# Patient Record
Sex: Female | Born: 1985 | Hispanic: No | Marital: Single | Smoking: Former smoker
Health system: Southern US, Community
[De-identification: ages and names within clinical notes are randomized; demographics above are authoritative.]

## PROBLEM LIST (undated history)

## (undated) ENCOUNTER — Inpatient Hospital Stay (HOSPITAL_COMMUNITY): Payer: Self-pay

## (undated) DIAGNOSIS — Z789 Other specified health status: Secondary | ICD-10-CM

## (undated) HISTORY — PX: NO PAST SURGERIES: SHX2092

---

## 2014-06-23 NOTE — L&D Delivery Note (Signed)
Patient is 29 y.o. [id number] [redacted]w[redacted]d admitted for SOL.   Delivery Note At 5:16 PM a viable female was delivered via Vaginal, Spontaneous Delivery (Presentation: Left Occiput Anterior). Shoulder dystocia, approximately 15 sec. McRoberts maneuver and suprapubic pressure applied. APGAR: 9, 9; weight 8 lb 5.3 oz.   Placenta status: spontaneous, intact.  Cord: 3 vessels with the following complications: nuchal cord x1.   Anesthesia: Epidural  Episiotomy: None Lacerations:  None Est. Blood Loss (mL):  50  Mom to postpartum.  Baby to Couplet care / Skin to Skin.  Tarri Abernethy, MD PGY-1 [facility]  03/15/2015, 5:43 PM

## 2014-10-25 LAB — OB RESULTS CONSOLE ANTIBODY SCREEN: ANTIBODY SCREEN: NEGATIVE

## 2014-10-25 LAB — OB RESULTS CONSOLE RPR: RPR: NONREACTIVE

## 2014-10-25 LAB — OB RESULTS CONSOLE ABO/RH: RH Type: POSITIVE

## 2014-10-25 LAB — OB RESULTS CONSOLE GC/CHLAMYDIA
CHLAMYDIA, DNA PROBE: POSITIVE
Gonorrhea: NEGATIVE

## 2014-10-25 LAB — OB RESULTS CONSOLE HIV ANTIBODY (ROUTINE TESTING): HIV: NONREACTIVE

## 2014-10-25 LAB — OB RESULTS CONSOLE HEPATITIS B SURFACE ANTIGEN: Hepatitis B Surface Ag: NEGATIVE

## 2015-01-03 ENCOUNTER — Inpatient Hospital Stay (HOSPITAL_COMMUNITY)
Admission: AD | Admit: 2015-01-03 | Discharge: 2015-01-03 | Disposition: A | Discharge status: Home / Self Care | Payer: Medicaid Other | Source: Ambulatory Visit | Attending: Obstetrics and Gynecology | Admitting: Obstetrics and Gynecology

## 2015-01-03 ENCOUNTER — Encounter (HOSPITAL_COMMUNITY): Payer: Self-pay | Admitting: *Deleted

## 2015-01-03 DIAGNOSIS — R102 Pelvic and perineal pain: Secondary | ICD-10-CM | POA: Diagnosis not present

## 2015-01-03 DIAGNOSIS — Z3A30 30 weeks gestation of pregnancy: Secondary | ICD-10-CM | POA: Diagnosis not present

## 2015-01-03 DIAGNOSIS — O9989 Other specified diseases and conditions complicating pregnancy, childbirth and the puerperium: Secondary | ICD-10-CM | POA: Diagnosis not present

## 2015-01-03 DIAGNOSIS — M5430 Sciatica, unspecified side: Secondary | ICD-10-CM | POA: Insufficient documentation

## 2015-01-03 DIAGNOSIS — M5432 Sciatica, left side: Secondary | ICD-10-CM

## 2015-01-03 DIAGNOSIS — R1030 Lower abdominal pain, unspecified: Secondary | ICD-10-CM | POA: Diagnosis present

## 2015-01-03 DIAGNOSIS — N949 Unspecified condition associated with female genital organs and menstrual cycle: Secondary | ICD-10-CM

## 2015-01-03 HISTORY — DX: Other specified health status: Z78.9

## 2015-01-03 NOTE — MAU Note (Signed)
Pt reports pain in bilateral lower abd and lower back, states she has a lot of pressure. Reports she is 6 months pregnant and [place]. Denies bleeding.

## 2015-01-03 NOTE — MAU Provider Note (Signed)
History     CSN: [id number]  Arrival date and time: 01/03/15 2124   First Provider Initiated Contact with Patient 01/03/15 2229      No chief complaint on file.  HPI  OB History    Gravida Para Term Preterm AB TAB SAB Ectopic Multiple Living   Patient is 29 y.o. [id number] [redacted]w[redacted]d here with complaints of Back pain and lower abdominal pain starting 3 days ago.  Describes pain as intermittent, worsening, cramping, and some shooting pain down legs and tingling.  Vomiting once this AM - NBNB.  Not taking any medications except PNV.  Just moved here from [place], last visit in May for [facility], looking for clinic to establish for prenatal care.  +FM, denies LOF, VB, contractions, vaginal discharge.    Past Medical History  Diagnosis Date  . Medical history non-contributory     Past Surgical History  Procedure Laterality Date  . No past surgeries      No family history on file.  History  Substance Use Topics  . Smoking status: Former Games developer  . Smokeless tobacco: Not on file  . Alcohol Use: No    Allergies: Allergies not on file  No prescriptions prior to admission    Review of Systems  Constitutional: Negative for fever and chills.  Eyes: Negative for blurred vision.  Respiratory: Negative for cough, shortness of breath and wheezing.   Cardiovascular: Negative for chest pain and leg swelling.  Gastrointestinal: Positive for abdominal pain. Negative for nausea, diarrhea, constipation and blood in stool.  Genitourinary: Negative for dysuria, urgency, frequency and hematuria.  Musculoskeletal: Positive for back pain. Negative for joint pain.  Neurological: Negative for headaches.  All other systems reviewed and are negative.  Physical Exam   Blood pressure 126/66, pulse 69, temperature 98.7 F (37.1 C), temperature source Oral, resp. rate 18, height  (1.803 m), weight 281 lb (127.461 kg), SpO2 100 %.  Physical Exam  Constitutional: She is  oriented to person, place, and time. She appears well-developed and well-nourished. No distress.  HENT:  Head: Normocephalic and atraumatic.  Mouth/Throat: Oropharynx is clear and moist.  Eyes: EOM are normal. Pupils are equal, round, and reactive to light.  Neck: Normal range of motion. Neck supple.  Cardiovascular: Normal rate, regular rhythm and intact distal pulses.   No murmur heard. Respiratory: Effort normal and breath sounds normal. No respiratory distress. She has no wheezes.  GI: She exhibits no distension. There is no tenderness.  gravid  Genitourinary: Vagina normal.  Musculoskeletal: She exhibits no edema.  TTP in paraspinal muscles in lumbar/sacral area +straight leg raise in L leg Gait normal  Neurological: She is alert and oriented to person, place, and time. No cranial nerve deficit.  Skin: Skin is warm and dry. No rash noted.  Psychiatric: She has a normal mood and affect. Her behavior is normal. Thought content normal.    Dilation: Fingertip Effacement (%): Thick Exam by:: Dr. Caleb Popp  MAU Course  Procedures  MDM FHM: Cat 1, reassuring Toco: No UCs, irritability  Assessment and Plan  Patient is 29 y.o. [id number] with viable IUP at [redacted]w[redacted]d Sciatica and round ligament pain  - d/c home - will set up f/u with [facility] - fetal kick counts reinforced - preterm labor precautions - reassured patient - rec pregnancy support belt  Erasmo Downer, MD, MPH PGY-2,  [facility] 01/03/2015 10:39 PM  I have participated in the care of this patient and I agree with the above. Cam HaiSHAW, Babyboy Loya CNM 5:04 AM 01/04/2015

## 2015-01-03 NOTE — Discharge Instructions (Signed)
Sciatica Sciatica is pain, weakness, numbness, or tingling along the path of the sciatic nerve. The nerve starts in the lower back and runs down the back of each leg. The nerve controls the muscles in the lower leg and in the back of the knee, while also providing sensation to the back of the thigh, lower leg, and the sole of your foot. Sciatica is a symptom of another medical condition. For instance, nerve damage or certain conditions, such as a herniated disk or bone spur on the spine, pinch or put pressure on the sciatic nerve. This causes the pain, weakness, or other sensations normally associated with sciatica. Generally, sciatica only affects one side of the body. CAUSES   Herniated or slipped disc.  Degenerative disk disease.  A pain disorder involving the narrow muscle in the buttocks (piriformis syndrome).  Pelvic injury or fracture.  Pregnancy.  Tumor (rare). SYMPTOMS  Symptoms can vary from mild to very severe. The symptoms usually travel from the low back to the buttocks and down the back of the leg. Symptoms can include:  Mild tingling or dull aches in the lower back, leg, or hip.  Numbness in the back of the calf or sole of the foot.  Burning sensations in the lower back, leg, or hip.  Sharp pains in the lower back, leg, or hip.  Leg weakness.  Severe back pain inhibiting movement. These symptoms may get worse with coughing, sneezing, laughing, or prolonged sitting or standing. Also, being overweight may worsen symptoms. DIAGNOSIS  Your caregiver will perform a physical exam to look for common symptoms of sciatica. He or she may ask you to do certain movements or activities that would trigger sciatic nerve pain. Other tests may be performed to find the cause of the sciatica. These may include:  Blood tests.  X-rays.  Imaging tests, such as an MRI or CT scan. TREATMENT  Treatment is directed at the cause of the sciatic pain. Sometimes, treatment is not necessary  and the pain and discomfort goes away on its own. If treatment is needed, your caregiver may suggest:  Over-the-counter medicines to relieve pain.  Prescription medicines, such as anti-inflammatory medicine, muscle relaxants, or narcotics.  Applying heat or ice to the painful area.  Steroid injections to lessen pain, irritation, and inflammation around the nerve.  Reducing activity during periods of pain.  Exercising and stretching to strengthen your abdomen and improve flexibility of your spine. Your caregiver may suggest losing weight if the extra weight makes the back pain worse.  Physical therapy.  Surgery to eliminate what is pressing or pinching the nerve, such as a bone spur or part of a herniated disk. HOME CARE INSTRUCTIONS   Only take over-the-counter or prescription medicines for pain or discomfort as directed by your caregiver.  Apply ice to the affected area for 20 minutes, 3-4 times a day for the first 48-72 hours. Then try heat in the same way.  Exercise, stretch, or perform your usual activities if these do not aggravate your pain.  Attend physical therapy sessions as directed by your caregiver.  Keep all follow-up appointments as directed by your caregiver.  Do not wear high heels or shoes that do not provide proper support.  Check your mattress to see if it is too soft. A firm mattress may lessen your pain and discomfort. SEEK IMMEDIATE MEDICAL CARE IF:   You lose control of your bowel or bladder (incontinence).  You have increasing weakness in the lower back, pelvis, buttocks,   or legs.  You have redness or swelling of your back.  You have a burning sensation when you urinate.  You have pain that gets worse when you lie down or awakens you at night.  Your pain is worse than you have experienced in the past.  Your pain is lasting longer than 4 weeks.  You are suddenly losing weight without reason. MAKE SURE YOU:  Understand these  instructions.  Will watch your condition.  Will get help right away if you are not doing well or get worse. Document Released: 06/03/2001 Document Revised: 12/09/2011 Document Reviewed: 10/19/2011 ExitCare Patient Information 2015 [employer]. This information is not intended to replace advice given to you by your health care provider. Make sure you discuss any questions you have with your health care provider.  

## 2015-01-22 ENCOUNTER — Encounter: Payer: Medicaid Other | Admitting: Advanced Practice Midwife

## 2015-02-26 ENCOUNTER — Inpatient Hospital Stay (HOSPITAL_COMMUNITY)
Admission: AD | Admit: 2015-02-26 | Discharge: 2015-02-26 | Disposition: A | Discharge status: Home / Self Care | Payer: Medicaid Other | Source: Ambulatory Visit | Attending: Obstetrics & Gynecology | Admitting: Obstetrics & Gynecology

## 2015-02-26 ENCOUNTER — Encounter (HOSPITAL_COMMUNITY): Payer: Self-pay | Admitting: *Deleted

## 2015-02-26 DIAGNOSIS — Z3493 Encounter for supervision of normal pregnancy, unspecified, third trimester: Secondary | ICD-10-CM | POA: Diagnosis not present

## 2015-02-26 NOTE — MAU Note (Signed)
Contractions started last night, have gotten closer and stronger. No bleeding.

## 2015-02-26 NOTE — Discharge Instructions (Signed)

## 2015-03-01 LAB — OB RESULTS CONSOLE GC/CHLAMYDIA
Chlamydia: POSITIVE
GC PROBE AMP, GENITAL: NEGATIVE

## 2015-03-01 LAB — OB RESULTS CONSOLE GBS: STREP GROUP B AG: NEGATIVE

## 2015-03-15 ENCOUNTER — Encounter (HOSPITAL_COMMUNITY): Payer: Self-pay

## 2015-03-15 ENCOUNTER — Inpatient Hospital Stay (HOSPITAL_COMMUNITY): Payer: Medicaid Other | Admitting: Anesthesiology

## 2015-03-15 ENCOUNTER — Inpatient Hospital Stay (HOSPITAL_COMMUNITY)
Admission: AD | Admit: 2015-03-15 | Discharge: 2015-03-17 | DRG: 774 | Disposition: A | Discharge status: Home / Self Care | Payer: Medicaid Other | Source: Ambulatory Visit | Attending: Obstetrics and Gynecology | Admitting: Obstetrics and Gynecology

## 2015-03-15 DIAGNOSIS — Z6841 Body Mass Index (BMI) 40.0 and over, adult: Secondary | ICD-10-CM

## 2015-03-15 DIAGNOSIS — O99214 Obesity complicating childbirth: Secondary | ICD-10-CM | POA: Diagnosis present

## 2015-03-15 DIAGNOSIS — O9882 Other maternal infectious and parasitic diseases complicating childbirth: Secondary | ICD-10-CM | POA: Diagnosis present

## 2015-03-15 DIAGNOSIS — Z3A40 40 weeks gestation of pregnancy: Secondary | ICD-10-CM | POA: Diagnosis present

## 2015-03-15 DIAGNOSIS — A749 Chlamydial infection, unspecified: Secondary | ICD-10-CM | POA: Diagnosis present

## 2015-03-15 DIAGNOSIS — Z87891 Personal history of nicotine dependence: Secondary | ICD-10-CM

## 2015-03-15 DIAGNOSIS — IMO0001 Reserved for inherently not codable concepts without codable children: Secondary | ICD-10-CM | POA: Insufficient documentation

## 2015-03-15 DIAGNOSIS — O480 Post-term pregnancy: Principal | ICD-10-CM | POA: Diagnosis present

## 2015-03-15 LAB — CBC
HCT: 26.7 % — ABNORMAL LOW (ref 36.0–46.0)
HEMOGLOBIN: 8.7 g/dL — AB (ref 12.0–15.0)
MCH: 26.3 pg (ref 26.0–34.0)
MCHC: 32.6 g/dL (ref 30.0–36.0)
MCV: 80.7 fL (ref 78.0–100.0)
PLATELETS: 299 10*3/uL (ref 150–400)
RBC: 3.31 MIL/uL — AB (ref 3.87–5.11)
RDW: 14.1 % (ref 11.5–15.5)
WBC: 14.7 10*3/uL — AB (ref 4.0–10.5)

## 2015-03-15 LAB — TYPE AND SCREEN
ABO/RH(D): B POS
ANTIBODY SCREEN: NEGATIVE

## 2015-03-15 LAB — GROUP B STREP BY PCR: Group B strep by PCR: NEGATIVE

## 2015-03-15 LAB — ABO/RH: ABO/RH(D): B POS

## 2015-03-15 LAB — RPR: RPR: NONREACTIVE

## 2015-03-15 MED ORDER — ONDANSETRON HCL 4 MG/2ML IJ SOLN: 4.0000 mg | Freq: Four times a day (QID) | INTRAMUSCULAR | Status: DC | PRN

## 2015-03-15 MED ORDER — LACTATED RINGERS IV SOLN
500.0000 mL | INTRAVENOUS | Status: DC | PRN
  Administered 2015-03-15: 500 mL via INTRAVENOUS

## 2015-03-15 MED ORDER — ZOLPIDEM TARTRATE 5 MG PO TABS: 5.0000 mg | ORAL_TABLET | Freq: Every evening | ORAL | Status: DC | PRN

## 2015-03-15 MED ORDER — OXYTOCIN 40 UNITS IN LACTATED RINGERS INFUSION - SIMPLE MED
62.5000 mL/h | INTRAVENOUS | Status: DC
  Filled 2015-03-15: qty 1000

## 2015-03-15 MED ORDER — ACETAMINOPHEN 325 MG PO TABS: 650.0000 mg | ORAL_TABLET | ORAL | Status: DC | PRN

## 2015-03-15 MED ORDER — OXYCODONE-ACETAMINOPHEN 5-325 MG PO TABS: 1.0000 | ORAL_TABLET | ORAL | Status: DC | PRN

## 2015-03-15 MED ORDER — LIDOCAINE HCL (PF) 1 % IJ SOLN
30.0000 mL | INTRAMUSCULAR | Status: DC | PRN
  Filled 2015-03-15: qty 30

## 2015-03-15 MED ORDER — FLEET ENEMA 7-19 GM/118ML RE ENEM
1.0000 | ENEMA | RECTAL | Status: DC | PRN
Start: 2015-03-15 — End: 2015-03-15

## 2015-03-15 MED ORDER — SIMETHICONE 80 MG PO CHEW: 80.0000 mg | CHEWABLE_TABLET | ORAL | Status: DC | PRN

## 2015-03-15 MED ORDER — ONDANSETRON HCL 4 MG PO TABS
4.0000 mg | ORAL_TABLET | ORAL | Status: DC | PRN
Start: 2015-03-15 — End: 2015-03-17

## 2015-03-15 MED ORDER — OXYTOCIN 40 UNITS IN LACTATED RINGERS INFUSION - SIMPLE MED
1.0000 m[IU]/min | INTRAVENOUS | Status: DC
  Administered 2015-03-15: 2 m[IU]/min via INTRAVENOUS

## 2015-03-15 MED ORDER — DIPHENHYDRAMINE HCL 25 MG PO CAPS: 25.0000 mg | ORAL_CAPSULE | Freq: Four times a day (QID) | ORAL | Status: DC | PRN

## 2015-03-15 MED ORDER — OXYCODONE-ACETAMINOPHEN 5-325 MG PO TABS: 2.0000 | ORAL_TABLET | ORAL | Status: DC | PRN

## 2015-03-15 MED ORDER — ONDANSETRON HCL 4 MG/2ML IJ SOLN: 4.0000 mg | INTRAMUSCULAR | Status: DC | PRN

## 2015-03-15 MED ORDER — LANOLIN HYDROUS EX OINT: TOPICAL_OINTMENT | CUTANEOUS | Status: DC | PRN

## 2015-03-15 MED ORDER — PHENYLEPHRINE 40 MCG/ML (10ML) SYRINGE FOR IV PUSH (FOR BLOOD PRESSURE SUPPORT)
80.0000 ug | PREFILLED_SYRINGE | INTRAVENOUS | Status: DC | PRN
  Filled 2015-03-15: qty 20
  Filled 2015-03-15: qty 2
  Filled 2015-03-15 (×2): qty 20

## 2015-03-15 MED ORDER — PRENATAL MULTIVITAMIN CH
1.0000 | ORAL_TABLET | Freq: Every day | ORAL | Status: DC
  Administered 2015-03-16 – 2015-03-17 (×2): 1 via ORAL
  Filled 2015-03-15 (×2): qty 1

## 2015-03-15 MED ORDER — EPHEDRINE 5 MG/ML INJ
10.0000 mg | INTRAVENOUS | Status: DC | PRN
Start: 2015-03-15 — End: 2015-03-15
  Filled 2015-03-15: qty 2

## 2015-03-15 MED ORDER — WITCH HAZEL-GLYCERIN EX PADS: 1.0000 "application " | MEDICATED_PAD | CUTANEOUS | Status: DC | PRN

## 2015-03-15 MED ORDER — LIDOCAINE HCL (PF) 1 % IJ SOLN
INTRAMUSCULAR | Status: DC | PRN
  Administered 2015-03-15: 9 mL via EPIDURAL
  Administered 2015-03-15: 8 mL via EPIDURAL

## 2015-03-15 MED ORDER — FENTANYL CITRATE (PF) 100 MCG/2ML IJ SOLN: 100.0000 ug | INTRAMUSCULAR | Status: DC | PRN

## 2015-03-15 MED ORDER — SODIUM BICARBONATE 8.4 % IV SOLN
INTRAVENOUS | Status: DC | PRN
  Administered 2015-03-15 (×2): 5 mL via EPIDURAL

## 2015-03-15 MED ORDER — TETANUS-DIPHTH-ACELL PERTUSSIS 5-2.5-18.5 LF-MCG/0.5 IM SUSP
0.5000 mL | Freq: Once | INTRAMUSCULAR | Status: AC
  Administered 2015-03-16: 0.5 mL via INTRAMUSCULAR
  Filled 2015-03-15: qty 0.5

## 2015-03-15 MED ORDER — SENNOSIDES-DOCUSATE SODIUM 8.6-50 MG PO TABS
2.0000 | ORAL_TABLET | ORAL | Status: DC
  Administered 2015-03-15 – 2015-03-16 (×2): 2 via ORAL
  Filled 2015-03-15 (×2): qty 2

## 2015-03-15 MED ORDER — FENTANYL 2.5 MCG/ML BUPIVACAINE 1/10 % EPIDURAL INFUSION (WH - ANES)
14.0000 mL/h | INTRAMUSCULAR | Status: DC | PRN
  Administered 2015-03-15 (×3): 14 mL/h via EPIDURAL
  Filled 2015-03-15 (×2): qty 125

## 2015-03-15 MED ORDER — AZITHROMYCIN 1 G PO PACK
1.0000 g | PACK | Freq: Once | ORAL | Status: AC
  Administered 2015-03-15: 1 g via ORAL
  Filled 2015-03-15: qty 1

## 2015-03-15 MED ORDER — CITRIC ACID-SODIUM CITRATE 334-500 MG/5ML PO SOLN: 30.0000 mL | ORAL | Status: DC | PRN

## 2015-03-15 MED ORDER — OXYTOCIN BOLUS FROM INFUSION
500.0000 mL | INTRAVENOUS | Status: DC
  Administered 2015-03-15: 500 mL via INTRAVENOUS

## 2015-03-15 MED ORDER — TERBUTALINE SULFATE 1 MG/ML IJ SOLN
0.2500 mg | Freq: Once | INTRAMUSCULAR | Status: DC | PRN
  Filled 2015-03-15: qty 1

## 2015-03-15 MED ORDER — LACTATED RINGERS IV SOLN
INTRAVENOUS | Status: DC
  Administered 2015-03-15 (×2): via INTRAVENOUS

## 2015-03-15 MED ORDER — BENZOCAINE-MENTHOL 20-0.5 % EX AERO: 1.0000 "application " | INHALATION_SPRAY | CUTANEOUS | Status: DC | PRN

## 2015-03-15 MED ORDER — IBUPROFEN 600 MG PO TABS
600.0000 mg | ORAL_TABLET | Freq: Four times a day (QID) | ORAL | Status: DC
  Administered 2015-03-15 – 2015-03-17 (×6): 600 mg via ORAL
  Filled 2015-03-15 (×7): qty 1

## 2015-03-15 MED ORDER — DIPHENHYDRAMINE HCL 50 MG/ML IJ SOLN: 12.5000 mg | INTRAMUSCULAR | Status: DC | PRN

## 2015-03-15 MED ORDER — DIBUCAINE 1 % RE OINT: 1.0000 "application " | TOPICAL_OINTMENT | RECTAL | Status: DC | PRN

## 2015-03-15 NOTE — Anesthesia Preprocedure Evaluation (Signed)
Anesthesia Evaluation  Patient identified by MRN, date of birth, ID band Patient awake    Reviewed: Allergy & Precautions, H&P , NPO status , Patient's Chart, lab work & pertinent test results  Airway Mallampati: II  TM Distance: >3 FB Neck ROM: full    Dental no notable dental hx.    Pulmonary former smoker,    Pulmonary exam normal        Cardiovascular negative cardio ROS Normal cardiovascular exam     Neuro/Psych negative neurological ROS  negative psych ROS   GI/Hepatic negative GI ROS, Neg liver ROS,   Endo/Other  Morbid obesity  Renal/GU negative Renal ROS     Musculoskeletal   Abdominal (+) + obese,   Peds  Hematology negative hematology ROS (+)   Anesthesia Other Findings   Reproductive/Obstetrics (+) Pregnancy                             Anesthesia Physical Anesthesia Plan  ASA: III  Anesthesia Plan: Epidural   Post-op Pain Management:    Induction:   Airway Management Planned:   Additional Equipment:   Intra-op Plan:   Post-operative Plan:   Informed Consent: I have reviewed the patients History and Physical, chart, labs and discussed the procedure including the risks, benefits and alternatives for the proposed anesthesia with the patient or authorized representative who has indicated his/her understanding and acceptance.     Plan Discussed with:   Anesthesia Plan Comments:         Anesthesia Quick Evaluation  

## 2015-03-15 NOTE — MAU Note (Signed)
Recheck cervix in 1 hour per Pincus Badder CNM

## 2015-03-15 NOTE — Plan of Care (Signed)
Problem: Consults Goal: Skin Care Protocol Initiated - if Braden Score 18 or less If consults are not indicated, leave blank or document N/A  Outcome: Not Applicable Date Met:  94/47/39 Braden scale 21.

## 2015-03-15 NOTE — Anesthesia Procedure Notes (Signed)
Epidural Patient location during procedure: OB Start time: 03/15/2015 9:08 AM End time: 03/15/2015 9:16 AM  Staffing Anesthesiologist: Leilani Able Performed by: anesthesiologist   Preanesthetic Checklist Completed: patient identified, surgical consent, pre-op evaluation, timeout performed, IV checked, risks and benefits discussed and monitors and equipment checked  Epidural Patient position: sitting Prep: site prepped and draped and DuraPrep Patient monitoring: continuous pulse ox and blood pressure Approach: midline Location: L3-L4 Injection technique: LOR air  Needle:  Needle type: Tuohy  Needle gauge: 17 G Needle length: 9 cm and 9 Needle insertion depth: 9 cm Catheter type: closed end flexible Catheter size: 19 Gauge Catheter at skin depth: 15 cm Test dose: negative and Other  Assessment Sensory level: T10 Events: blood not aspirated, injection not painful, no injection resistance, negative IV test and no paresthesia  Additional Notes Reason for block:procedure for pain

## 2015-03-15 NOTE — MAU Note (Signed)
Pt presents via EMS with complaint of contractions.

## 2015-03-15 NOTE — H&P (Signed)
LABOR ADMISSION HISTORY AND PHYSICAL  Andrea Silva is a 29 y.o. female [phone] with IUP at [redacted]w[redacted]d by early [place] presenting for SOL. She reports +FM, + contractions, No LOF, no VB. She plans on breast and bottle feeding. She requests interval BTL for birth control.  Dating: By early [place] --->  Estimated Date of Delivery: 03/13/15  Sono:  None on file   Past Medical History: Past Medical History  Diagnosis Date  . Medical history non-contributory     Past Surgical History: Past Surgical History  Procedure Laterality Date  . No past surgeries      Obstetrical History: OB History    Gravida Para Term Preterm AB TAB SAB Ectopic Multiple Living   Social History: Social History   Social History  . Marital Status: Single    Spouse Name: N/A  . Number of Children: N/A  . Years of Education: N/A   Social History Main Topics  . Smoking status: Former Games developer  . Smokeless tobacco: Never Used  . Alcohol Use: No  . Drug Use: No  . Sexual Activity: Yes    Birth Control/ Protection: None   Other Topics Concern  . None   Social History Narrative    Family History: History reviewed. No pertinent family history.  Allergies: Allergies  Allergen Reactions  . Latex Rash    Prescriptions prior to admission  Medication Sig Dispense Refill Last Dose  . acetaminophen (TYLENOL) 325 MG tablet Take 650 mg by mouth every 6 (six) hours as needed for moderate pain.   Past Week at Unknown time  . calcium carbonate (TUMS - DOSED IN MG ELEMENTAL CALCIUM) 500 MG chewable tablet Chew 1 tablet by mouth as needed for indigestion or heartburn.   03/14/2015 at Unknown time  . Prenatal Vit-Fe Fumarate-FA (PRENATAL MULTIVITAMIN) TABS tablet Take 1 tablet by mouth daily at 12 noon.   03/14/2015 at Unknown time     Review of Systems   All systems reviewed and negative except as stated in HPI  BP 122/60 mmHg  Pulse 63  Temp(Src) 98 F (36.7 C) (Oral)  Resp 16  Ht   (1.803 m)  Wt 290 lb (131.543 kg)  BMI 40.46 kg/m2  SpO2 99% General appearance: alert, cooperative and mild distress Lungs: non-labored breathing Heart: regular rate  Abdomen: soft, non-tender Extremities: no sign of DVT, no edema Presentation: cephalic Fetal monitoringBaseline: 130 bpm, Variability: Good {> 6 bpm) and Accelerations: Reactive Uterine activityFrequency: Every 2-4 minutes, Duration: 60-80 seconds and Intensity: moderate Dilation: 6 Effacement (%): 70 Station: -3 Exam by:: S Nix RN   Prenatal labs: ABO, Rh: --/--/B POS (09/22 0815) Antibody: NEG (09/22 0815) Rubella:  Immune RPR: Nonreactive (05/04 0000)  HBsAg: Negative (05/04 0000)  HIV: Non-reactive (05/04 0000)  GBS: Negative (09/08 0000)    Prenatal Transfer Tool  Maternal Diabetes: No Maternal Substance Abuse:  No Significant Maternal Medications:  None Significant Maternal Lab Results: Lab values include: Other: positive chlamydia (treated with azithromycin 09/22)  Results for orders placed or performed during the hospital encounter of 03/15/15 (from the past 24 hour(s))  Group B strep by PCR   Collection Time: 03/15/15  7:52 AM  Result Value Ref Range   Group B strep by PCR NEGATIVE NEGATIVE  CBC   Collection Time: 03/15/15  8:15 AM  Result Value Ref Range   WBC 14.7 (H) 4.0 - 10.5 K/uL  RBC 3.31 (L) 3.87 - 5.11 MIL/uL   Hemoglobin 8.7 (L) 12.0 - 15.0 g/dL   HCT 16.1 (L) 09.6 - 04.5 %   MCV 80.7 78.0 - 100.0 fL   MCH 26.3 26.0 - 34.0 pg   MCHC 32.6 30.0 - 36.0 g/dL   RDW 40.9 81.1 - 91.4 %   Platelets 299 150 - 400 K/uL  Type and screen   Collection Time: 03/15/15  8:15 AM  Result Value Ref Range   ABO/RH(D) B POS    Antibody Screen NEG    Sample Expiration 03/18/2015     Patient Active Problem List   Diagnosis Date Noted  . Active labor at term 03/15/2015    Assessment: Andrea Silva is a 29 y.o. G4P3003 at [redacted]w[redacted]d here for SOL.  #Labor:expectant  management #Pain: epidural #FWB: Category 1 #ID:  GBS neg #MOF: breast and bottle #MOC:interval BTL #Circ:  Unknown gender  Tarri Abernethy, MD PGY-1 [facility]

## 2015-03-15 NOTE — MAU Note (Signed)
Notified Philipp Deputy CNM patient 5/80/-2 intact ucs every 2-3 miserable wants epidural.

## 2015-03-16 ENCOUNTER — Encounter (HOSPITAL_COMMUNITY): Payer: Self-pay | Admitting: Family Medicine

## 2015-03-16 NOTE — Anesthesia Postprocedure Evaluation (Signed)
  Anesthesia Post-op Note  Patient: Andrea Silva  Procedure(s) Performed: * No procedures listed *  Patient Location: Mother/Baby  Anesthesia Type:Epidural  Level of Consciousness: awake and alert   Airway and Oxygen Therapy: Patient Spontanous Breathing  Post-op Pain: mild  Post-op Assessment: Post-op Vital signs reviewed, Patient's Cardiovascular Status Stable, Respiratory Function Stable, No signs of Nausea or vomiting, Pain level controlled, No headache, Spinal receding and Patient able to bend at knees              Post-op Vital Signs: Reviewed  Last Vitals:  Filed Vitals:   03/16/15 0528  BP: 108/58  Pulse: 59  Temp: 36.8 C  Resp: 20    Complications: No apparent anesthesia complications

## 2015-03-16 NOTE — Progress Notes (Signed)
MD called to clarify patient's rubella status. No new orders at this time for further lab work.

## 2015-03-16 NOTE — Progress Notes (Signed)
POSTPARTUM PROGRESS NOTE  Post Partum Day 1 Andrea Silva is a 29 y.o. G4P4001 s/p NSVD.  Pt denies problems with ambulating, voiding or po intake. Pain is well controlled. She reports passing gas but not BM.  Plan for birth control is bilateral tubal ligation.  Method of Feeding: breast and formula  PE:  BP 133/54 mmHg  Pulse 70  Temp(Src) 98.3 F (36.8 C) (Oral)  Resp 16  Ht  (1.803 m)  Wt 290 lb (131.543 kg)  BMI 40.46 kg/m2  SpO2 98%  Breastfeeding? Unknown Gen: Well appearing, NAD Lung: Normal WOB CV: regular rate Abdomen: Soft, ND, appropriately tender Fundus firm Ext: warm well perfused  Plan for discharge: PPD2, continue routine pp care. Will message clinic to schedule appt to discuss interval BTL  Federico Flake, MD 1:31 PM  I have seen and examined this patient and agree with the medical student's note and have performed my own exam and assessment. Medical student note seen below.    Medical student note:  Subjective:  Andrea Silva is a 29 y.o. G4P4001 [redacted]w[redacted]d s/p SVD  on 9/22.  No acute events overnight.  Pt denies problems with ambulating, voiding or po intake.  She denies nausea or vomiting.  Pain is well controlled.  She has not had flatus. She has not had bowel movement.  Lochia Moderate.   Objective: Blood pressure 108/58, pulse 59, temperature 98.2 F (36.8 C), temperature source Oral, resp. rate 20, height  (1.803 m), weight 131.543 kg (290 lb), SpO2 98 %, unknown if currently breastfeeding.  Physical Exam:  General: alert, cooperative and no distress Chest: CTAB Heart: RRR no m/r/g Abdomen: +BS, soft, nontender,  Uterine Fundus: firm DVT Evaluation: No calf swelling or tenderness Extremities: no edema   Recent Labs  03/15/15 0815  HGB 8.7*  HCT 26.7*   Assessment/Plan:  ASSESSMENT: Andrea Silva is a 29 y.o. G4P4001 [redacted]w[redacted]d s/p SVD  on 9/22. She is doing well and has no acute complaints.  Plan for  discharge later today or tomorrow depending on patient preference. Patient is breastfeeding and needs a Lactation consult   LOS: 1 day   Chelsea Perfect 03/16/2015, 8:00 AM

## 2015-03-16 NOTE — Progress Notes (Signed)
CLINICAL SOCIAL WORK MATERNAL/CHILD NOTE  Patient Details  Name: Andrea Silva Silva MRN: [id number] Date of Birth: 06-Oct-2012  Date:  01/30/2015  Clinical Social Worker Initiating Note:  Lucita Ferrara, LCSW and Elissa Hefty, MSW intern   Date/ Time Initiated:  03/16/15/1345     Child's Name:  Andrea Silva Silva    Legal Guardian:  Lonn Georgia (MOB) and Hollice Espy (FOB)   Need for Interpreter:  None   Date of Referral:  15-Dec-2014     Reason for Referral:  Late or No Prenatal Care: Lapse in care for 4 months. Per MOB it was due to her transition from [place] to [place] and not being able to find a OB that would see her.    Referral Source:  [facility]   Address:  [place], [place] [address]  Phone number:  [phone]   Household Members:  Self, Minor Children (Ages 64,3 and 27 months) , Spouse   Natural Supports (not living in the home):  Children, Immediate Family, Parent, Spouse/significant other   Professional Supports: None   Employment: Unemployed   Type of Work:     Education:      Pensions consultant:  [employer]   Other Resources:  [employer], Physicist, medical    Cultural/Religious Considerations Which May Impact Care: None Reported   Strengths:  Ability to meet basic needs , Home prepared for child , Pediatrician chosen    Risk Factors/Current Problems:  None   Cognitive State:  Able to Concentrate , Goal Oriented , Linear Thinking    Mood/Affect:  Happy , Interested , Bright , Comfortable    CSW Assessment:  CSW and MSW intern presented in patient's room for a consult placed by central nursery due to a lapse in prenatal care. MOB displayed a happy affect and was actively engaged during the assessment. Per MOB, she was happy with the birthing process at the hospital and denied questions or concerns related to her transition to pospartum. MOB stated the sex of the infant was unknown until the day of the delivery. MOB expressed it being difficult to find  neutral friendly baby stuff but confirmed she has all the infant's basic needs met. MOB voiced a sense of surprise when she found it she had another Andrea Silva but was overall happy about the news. Per MOB, she has three other children ages 31,3,and 61 months old. MOB stated her two oldest boys are transitioning well with the news of being big brothers again but admitted to not being sure how her 62 month old daughter will transition.   MOB stated they moved from [place] to [place] because of better financial opportunities for her husband. Per MOB, she is currently unemployed but plans to start looking for employment in about 6 weeks. When MSW intern inquired about their support system and moving transition, MOB stated she used to live in [place] and that her mother and several other family members reside in the area. MOB voiced having a good support system and being well prepared and excited to take the infant home to meet her siblings.   MOB confirmed she had a lapse in prenatal care. Per MOB, the lapse was due to them moving from [place] to [place] and not being able to find an OB that would see her because she was "too far along." MOB stated she looked for an OB and made several phone calls but was never able to get an appointment with an OB. MOB expressed feelings of frustration due to  the fact she was not receiving prenatal care, and was worried about the infant's health.  MOB reported the [facility] suggested the health department. MOB informed CSW that she was able to get seen at the health department and had 2 prenatal visit there, prior to their move she also stated she had several prenatal care visits in [place]. Per MOB she will have her post-partum visit at the health department and plans to take all her children to [facility].   MSW intern provided education on perinatal mood disorders. MOB denied any prior history of post partum anxiety or depression, but  she did mention being anxious during the pregnancy because she did not know the sex of the baby. MOB denied having any further questions or concerns about the topic but was grateful for the information provided. MSW intern informed MOB about the hospital's support group, "Feelings After Birth," and invited MOB to attend if she ever felt like she needed the extra support or simply utilize it as a way to meet other mothers.   MSW intern informed MOB about the hospital's drug screening policies. MOB displayed an understanding about the policy and did not display a sense of concern. MOB denied any substance use during the pregnancy. MSW intern informed MOB the UDS was negative and that the MDS screnning would take about 5-7 business days to come back. MOB denied having any further questions or concerns.  MOB agreed to contact CSW if needs arise.   CSW Plan/Description:   Patient/Family Education" MSW intern provided education on perinatal mood disorders.  CSW to monitor drug screenings and will make a report if they come back positive.  No Further Intervention Required/No Barriers to Discharge    Yazmin Rodriguez, Student-SW 03/16/2015, 2:09 PM  

## 2015-03-17 MED ORDER — IBUPROFEN 600 MG PO TABS: 600.0000 mg | ORAL_TABLET | Freq: Four times a day (QID) | ORAL | Status: DC

## 2015-03-17 NOTE — Discharge Instructions (Signed)

## 2015-03-17 NOTE — Discharge Summary (Signed)
Obstetric Discharge Summary  Reason for Admission: onset of labor Prenatal Procedures: none Intrapartum Procedures: spontaneous vaginal delivery Postpartum Procedures: none Complications-Operative and Postpartum: none  Delivery Note At 5:16 PM a viable female was delivered via Vaginal, Spontaneous Delivery (Presentation: Left Occiput Anterior). Shoulder dystocia, approximately 15 sec. McRoberts maneuver and suprapubic pressure applied. APGAR: 9, 9; weight 8 lb 5.3 oz.  Placenta status: spontaneous, intact. Cord: 3 vessels with the following complications: nuchal cord x1.   Anesthesia: Epidural  Episiotomy: None Lacerations: None Est. Blood Loss (mL): 50  Mom to postpartum. Baby to Couplet care / Skin to Skin.  Tarri Abernethy, MD PGY-1 [facility]  03/15/2015, 5:43 PM  Hospital Course:  Principal Problem:   NSVD (normal spontaneous vaginal delivery)   Andrea Silva is a 29 y.o. [id number] s/p nsvd.  Patient was admitted in labor.  She has postpartum course that was complicated by mild shoulder dystocia relieved with mcroberts, and chlamydia positive on intake labs (treated). The pt feels ready to go home and  will be discharged with outpatient follow-up. Will refer to our clinic for interval tubal.  Today: No acute events overnight.  Pt denies problems with ambulating, voiding or po intake.  She denies nausea or vomiting.  Pain is well controlled.  She has had flatus. She has not had bowel movement.  Lochia Small.    Physical Exam:  General: alert, cooperative and appears stated age 29: appropriate Uterine Fundus: firm DVT Evaluation: No evidence of DVT seen on physical exam.  H/H: Lab Results  Component Value Date/Time   HGB 8.7* 03/15/2015 08:15 AM   HCT 26.7* 03/15/2015 08:15 AM    Discharge Diagnoses: Term Pregnancy-delivered  Discharge Information: Date: 03/17/2015 Activity: pelvic rest Diet: routine  Medications: PNV Breast  feeding:  Yes Condition: stable Instructions: refer to handout Discharge to: home   Discharge Instructions    Call MD for:  difficulty breathing, headache or visual disturbances    Complete by:  As directed      Call MD for:  extreme fatigue    Complete by:  As directed      Call MD for:  hives    Complete by:  As directed      Call MD for:  persistant dizziness or light-headedness    Complete by:  As directed      Call MD for:  persistant nausea and vomiting    Complete by:  As directed      Call MD for:  severe uncontrolled pain    Complete by:  As directed      Call MD for:  temperature >100.4    Complete by:  As directed      Call MD for:    Complete by:  As directed      Diet general    Complete by:  As directed      Sexual acrtivity    Complete by:  As directed   Nothing in the vagina for at least 2 weeks            Medication List    STOP taking these medications        calcium carbonate 500 MG chewable tablet  Commonly known as:  TUMS - dosed in mg elemental calcium      TAKE these medications        acetaminophen 325 MG tablet  Commonly known as:  TYLENOL  Take 650 mg by mouth every 6 (six) hours as needed  for moderate pain.     ibuprofen 600 MG tablet  Commonly known as:  ADVIL,MOTRIN  Take 1 tablet (600 mg total) by mouth every 6 (six) hours.     prenatal multivitamin Tabs tablet  Take 1 tablet by mouth daily at 12 noon.           Follow-up Information    Follow up with [facility] In 6 weeks.   Specialty:  Home Health Services   Contact information:   Care Coordination for [facility] [address] [place] [address] [phone]       Silvano Bilis ,MD OB Fellow 03/17/2015,7:21 AM

## 2015-03-17 NOTE — Lactation Note (Signed)
This note was copied from the chart of Andrea [facility]. Lactation Consultation Note Mom has a 26 yr old child and BF him for 2 months. Had pain in breast so she stopped. She didn't BF her 27 yr old or her 58 month. Mom planned on breast and bottle. Explained supply and demand, I&O, Cluster feeding, and engorgement. Mom encouraged to feed baby 8-12 times/24 hours and with feeding cues. Referred to Baby and Me Book in Breastfeeding section Pg. 22-23 for position options and Proper latch demonstration. Mom encouraged to do skin-to-skin. Educated about newborn behavior. WH/LC brochure given w/resources, support groups and LC services. Patient Name: Andrea Silva [id number]'Y Date: 03/17/2015 Reason for consult: Initial assessment   Maternal Data Has patient been taught Hand Expression?: Yes Does the patient have breastfeeding experience prior to this delivery?: Yes  Feeding    LATCH Score/Interventions       Type of Nipple: Everted at rest and after stimulation  Comfort (Breast/Nipple): Soft / non-tender           Lactation Tools Discussed/Used     Consult Status Consult Status: Complete    CARVER, LAURA G 03/17/2015, 10:37 AM

## 2015-03-22 ENCOUNTER — Encounter: Payer: Self-pay | Admitting: Obstetrics & Gynecology

## 2015-03-28 NOTE — H&P (Signed)
LABOR ADMISSION HISTORY AND PHYSICAL  Andrea Silva is a 29 y.o. female [phone] with IUP at [redacted]w[redacted]d by early [place] presenting for SOL. She reports +FM, + contractions, No LOF, no VB. She plans on breast and bottle feeding. She requests interval BTL for birth control.  Dating: By early [place] ---> Estimated Date of Delivery: 03/13/15  Sono: None on file   Past Medical History: Past Medical History  Diagnosis Date  . Medical history non-contributory     Past Surgical History: Past Surgical History  Procedure Laterality Date  . No past surgeries      Obstetrical History: OB History    Gravida Para Term Preterm AB TAB SAB Ectopic Multiple Living   Social History: Social History   Social History  . Marital Status: Single    Spouse Name: N/A  . Number of Children: N/A  . Years of Education: N/A   Social History Main Topics  . Smoking status: Former Games developer  . Smokeless tobacco: Never Used  . Alcohol Use: No  . Drug Use: No  . Sexual Activity: Yes    Birth Control/ Protection: None   Other Topics Concern  . None   Social History Narrative    Family History: History reviewed. No pertinent family history.  Allergies: Allergies  Allergen Reactions  . Latex Rash    Prescriptions prior to admission  Medication Sig Dispense Refill Last Dose  . acetaminophen (TYLENOL) 325 MG tablet Take 650 mg by mouth every 6 (six) hours as needed for moderate pain.   Past Week at Unknown time  . calcium carbonate (TUMS - DOSED IN MG ELEMENTAL CALCIUM) 500 MG chewable tablet Chew 1 tablet by mouth as needed for indigestion or heartburn.   03/14/2015 at Unknown time  . Prenatal Vit-Fe Fumarate-FA (PRENATAL MULTIVITAMIN) TABS tablet Take 1 tablet by mouth daily at 12 noon.   03/14/2015 at Unknown time     Review of Systems   All systems reviewed and  negative except as stated in HPI  BP 122/60 mmHg  Pulse 63  Temp(Src) 98 F (36.7 C) (Oral)  Resp 16  Ht  (1.803 m)  Wt 290 lb (131.543 kg)  BMI 40.46 kg/m2  SpO2 99% General appearance: alert, cooperative and mild distress Lungs: non-labored breathing Heart: regular rate  Abdomen: soft, non-tender Extremities: no sign of DVT, no edema Presentation: cephalic Fetal monitoringBaseline: 130 bpm, Variability: Good {> 6 bpm) and Accelerations: Reactive Uterine activityFrequency: Every 2-4 minutes, Duration: 60-80 seconds and Intensity: moderate Dilation: 6 Effacement (%): 70 Station: -3 Exam by:: S Nix RN   Prenatal labs: ABO, Rh: --/--/B POS (09/22 0815) Antibody: NEG (09/22 0815) Rubella:  Immune RPR: Nonreactive (05/04 0000)  HBsAg: Negative (05/04 0000)  HIV: Non-reactive (05/04 0000)  GBS: Negative (09/08 0000)    Prenatal Transfer Tool  Maternal Diabetes: No Maternal Substance Abuse: No Significant Maternal Medications: None Significant Maternal Lab Results: Lab values include: Other: positive chlamydia (treated with azithromycin 09/22)  Results for orders placed or performed during the hospital encounter of 03/15/15 (from the past 24 hour(s))  Group B strep by PCR   Collection Time: 03/15/15 7:52 AM  Result Value Ref Range   Group B strep by PCR NEGATIVE NEGATIVE  CBC   Collection Time: 03/15/15 8:15 AM  Result Value Ref Range   WBC 14.7 (H) 4.0 - 10.5 K/uL   RBC 3.31 (L) 3.87 -  5.11 MIL/uL   Hemoglobin 8.7 (L) 12.0 - 15.0 g/dL   HCT 16.1 (L) 09.6 - 04.5 %   MCV 80.7 78.0 - 100.0 fL   MCH 26.3 26.0 - 34.0 pg   MCHC 32.6 30.0 - 36.0 g/dL   RDW 40.9 81.1 - 91.4 %   Platelets 299 150 - 400 K/uL  Type and screen   Collection Time: 03/15/15 8:15 AM  Result Value Ref Range   ABO/RH(D) B POS    Antibody Screen NEG    Sample Expiration 03/18/2015     Patient Active  Problem List   Diagnosis Date Noted  . Active labor at term 03/15/2015    Assessment: Andrea Silva is a 29 y.o. G4P3003 at [redacted]w[redacted]d here for SOL.  #Labor:expectant management #Pain:epidural #FWB:Category 1 #ID: GBS neg #MOF: breast and bottle #MOC:interval BTL #Circ: Unknown gender  Illene Bolus CNM

## 2015-04-02 ENCOUNTER — Ambulatory Visit: Payer: Medicaid Other | Admitting: Advanced Practice Midwife

## 2015-06-24 NOTE — L&D Delivery Note (Signed)
Julien NordmannKayla Girvan is a 30 y.o. now [id number] who presented with SOL, now s/p NSVD.   Delivery Note Patient complete and pushing. Head delivered OA with retistution to the the left. Shoulders and body delivered in usual fashion. Cord clampled and cut after 1 minute delay. Placenta delivered and found to be intact.   At 4:58 PM a viable female was delivered via  (presentation: vertex; OA).  APGAR: 8, 9; weight  Pending.   Placenta status: intact  Cord: 3-vessel   Anesthesia:  Epidural Episiotomy:  none Lacerations: L periurethral abraison Suture Repair: none Est. Blood Loss (mL):  100  Mom to postpartum.  Baby to Couplet care / Skin to Skin.  Dr. Genevie AnnSchenk was present at delivery.   Kandra NicolasJulie P Degele 02/26/2016, 5:12 PM  OB FELLOW DELIVERY ATTESTATION  I was gloved and present for the delivery in its entirety, and I agree with the above resident's note.    Ernestina PennaNicholas Zakiah Gauthreaux, MD 5:28 PM

## 2016-02-26 ENCOUNTER — Inpatient Hospital Stay (HOSPITAL_COMMUNITY): Payer: Medicaid Other | Admitting: Anesthesiology

## 2016-02-26 ENCOUNTER — Inpatient Hospital Stay (HOSPITAL_COMMUNITY)
Admission: AD | Admit: 2016-02-26 | Discharge: 2016-02-27 | DRG: 775 | Disposition: A | Discharge status: Home / Self Care | Payer: Medicaid Other | Source: Ambulatory Visit | Attending: Family Medicine | Admitting: Family Medicine

## 2016-02-26 ENCOUNTER — Encounter (HOSPITAL_COMMUNITY): Payer: Self-pay | Admitting: *Deleted

## 2016-02-26 DIAGNOSIS — Z3A38 38 weeks gestation of pregnancy: Secondary | ICD-10-CM | POA: Diagnosis not present

## 2016-02-26 DIAGNOSIS — Z87891 Personal history of nicotine dependence: Secondary | ICD-10-CM | POA: Diagnosis not present

## 2016-02-26 DIAGNOSIS — Z833 Family history of diabetes mellitus: Secondary | ICD-10-CM | POA: Diagnosis not present

## 2016-02-26 DIAGNOSIS — O99214 Obesity complicating childbirth: Secondary | ICD-10-CM | POA: Diagnosis present

## 2016-02-26 DIAGNOSIS — E669 Obesity, unspecified: Secondary | ICD-10-CM | POA: Diagnosis present

## 2016-02-26 DIAGNOSIS — K219 Gastro-esophageal reflux disease without esophagitis: Secondary | ICD-10-CM | POA: Diagnosis present

## 2016-02-26 DIAGNOSIS — Z8249 Family history of ischemic heart disease and other diseases of the circulatory system: Secondary | ICD-10-CM

## 2016-02-26 DIAGNOSIS — Z6833 Body mass index (BMI) 33.0-33.9, adult: Secondary | ICD-10-CM | POA: Diagnosis not present

## 2016-02-26 DIAGNOSIS — IMO0001 Reserved for inherently not codable concepts without codable children: Secondary | ICD-10-CM

## 2016-02-26 DIAGNOSIS — O9962 Diseases of the digestive system complicating childbirth: Secondary | ICD-10-CM | POA: Diagnosis present

## 2016-02-26 DIAGNOSIS — Z3483 Encounter for supervision of other normal pregnancy, third trimester: Secondary | ICD-10-CM | POA: Diagnosis present

## 2016-02-26 LAB — COMPREHENSIVE METABOLIC PANEL
ALT: 10 U/L — AB (ref 14–54)
AST: 18 U/L (ref 15–41)
Albumin: 2.9 g/dL — ABNORMAL LOW (ref 3.5–5.0)
Alkaline Phosphatase: 153 U/L — ABNORMAL HIGH (ref 38–126)
Anion gap: 8 (ref 5–15)
BILIRUBIN TOTAL: 0.9 mg/dL (ref 0.3–1.2)
BUN: 10 mg/dL (ref 6–20)
CHLORIDE: 105 mmol/L (ref 101–111)
CO2: 21 mmol/L — ABNORMAL LOW (ref 22–32)
CREATININE: 0.6 mg/dL (ref 0.44–1.00)
Calcium: 9 mg/dL (ref 8.9–10.3)
GFR calc Af Amer: >60 mL/min (ref >60)
GLUCOSE: 88 mg/dL (ref 65–99)
Potassium: 3.9 mmol/L (ref 3.5–5.1)
Sodium: 134 mmol/L — ABNORMAL LOW (ref 135–145)
Total Protein: 6.3 g/dL — ABNORMAL LOW (ref 6.5–8.1)

## 2016-02-26 LAB — CBC
HCT: 30.6 % — ABNORMAL LOW (ref 36.0–46.0)
Hemoglobin: 10.6 g/dL — ABNORMAL LOW (ref 12.0–15.0)
MCH: 28.1 pg (ref 26.0–34.0)
MCHC: 34.6 g/dL (ref 30.0–36.0)
MCV: 81.2 fL (ref 78.0–100.0)
Platelets: 304 10*3/uL (ref 150–400)
RBC: 3.77 MIL/uL — ABNORMAL LOW (ref 3.87–5.11)
RDW: 14.7 % (ref 11.5–15.5)
WBC: 18.2 10*3/uL — AB (ref 4.0–10.5)

## 2016-02-26 LAB — DIFFERENTIAL
Basophils Absolute: 0 10*3/uL (ref 0.0–0.1)
Basophils Relative: 0 %
EOS ABS: 0.2 10*3/uL (ref 0.0–0.7)
EOS PCT: 1 %
LYMPHS ABS: 2.2 10*3/uL (ref 0.7–4.0)
Lymphocytes Relative: 12 %
MONOS PCT: 4 %
Monocytes Absolute: 0.7 10*3/uL (ref 0.1–1.0)
Neutro Abs: 15.2 10*3/uL — ABNORMAL HIGH (ref 1.7–7.7)
Neutrophils Relative %: 83 %

## 2016-02-26 LAB — TYPE AND SCREEN
ABO/RH(D): B POS
Antibody Screen: NEGATIVE

## 2016-02-26 MED ORDER — OXYTOCIN BOLUS FROM INFUSION
500.0000 mL | Freq: Once | INTRAVENOUS | Status: AC
  Administered 2016-02-26: 500 mL via INTRAVENOUS

## 2016-02-26 MED ORDER — FENTANYL 2.5 MCG/ML BUPIVACAINE 1/10 % EPIDURAL INFUSION (WH - ANES)
14.0000 mL/h | INTRAMUSCULAR | Status: DC | PRN
  Administered 2016-02-26: 14 mL/h via EPIDURAL
  Administered 2016-02-26: 15.5 mL/h via EPIDURAL
  Filled 2016-02-26: qty 125

## 2016-02-26 MED ORDER — SENNOSIDES-DOCUSATE SODIUM 8.6-50 MG PO TABS
2.0000 | ORAL_TABLET | ORAL | Status: DC
  Administered 2016-02-26: 2 via ORAL
  Filled 2016-02-26: qty 2

## 2016-02-26 MED ORDER — EPHEDRINE 5 MG/ML INJ
10.0000 mg | INTRAVENOUS | Status: DC | PRN
  Filled 2016-02-26: qty 4

## 2016-02-26 MED ORDER — DIPHENHYDRAMINE HCL 25 MG PO CAPS: 25.0000 mg | ORAL_CAPSULE | Freq: Four times a day (QID) | ORAL | Status: DC | PRN

## 2016-02-26 MED ORDER — ACETAMINOPHEN 325 MG PO TABS: 650.0000 mg | ORAL_TABLET | ORAL | Status: DC | PRN

## 2016-02-26 MED ORDER — LIDOCAINE HCL (PF) 1 % IJ SOLN
30.0000 mL | INTRAMUSCULAR | Status: DC | PRN
  Filled 2016-02-26: qty 30

## 2016-02-26 MED ORDER — PRENATAL MULTIVITAMIN CH
1.0000 | ORAL_TABLET | Freq: Every day | ORAL | Status: DC
  Administered 2016-02-27: 1 via ORAL
  Filled 2016-02-26: qty 1

## 2016-02-26 MED ORDER — ONDANSETRON HCL 4 MG/2ML IJ SOLN: 4.0000 mg | INTRAMUSCULAR | Status: DC | PRN

## 2016-02-26 MED ORDER — LACTATED RINGERS IV SOLN: 500.0000 mL | INTRAVENOUS | Status: DC | PRN

## 2016-02-26 MED ORDER — ONDANSETRON HCL 4 MG PO TABS: 4.0000 mg | ORAL_TABLET | ORAL | Status: DC | PRN

## 2016-02-26 MED ORDER — LACTATED RINGERS IV SOLN
INTRAVENOUS | Status: DC
  Administered 2016-02-26: 14:00:00 via INTRAVENOUS

## 2016-02-26 MED ORDER — PHENYLEPHRINE 40 MCG/ML (10ML) SYRINGE FOR IV PUSH (FOR BLOOD PRESSURE SUPPORT)
80.0000 ug | PREFILLED_SYRINGE | INTRAVENOUS | Status: DC | PRN
  Filled 2016-02-26: qty 10
  Filled 2016-02-26: qty 5

## 2016-02-26 MED ORDER — IBUPROFEN 600 MG PO TABS
600.0000 mg | ORAL_TABLET | Freq: Four times a day (QID) | ORAL | Status: DC
  Administered 2016-02-26 – 2016-02-27 (×4): 600 mg via ORAL
  Filled 2016-02-26 (×4): qty 1

## 2016-02-26 MED ORDER — FLEET ENEMA 7-19 GM/118ML RE ENEM: 1.0000 | ENEMA | RECTAL | Status: DC | PRN

## 2016-02-26 MED ORDER — OXYCODONE-ACETAMINOPHEN 5-325 MG PO TABS: 1.0000 | ORAL_TABLET | ORAL | Status: DC | PRN

## 2016-02-26 MED ORDER — ONDANSETRON HCL 4 MG/2ML IJ SOLN: 4.0000 mg | Freq: Four times a day (QID) | INTRAMUSCULAR | Status: DC | PRN

## 2016-02-26 MED ORDER — LACTATED RINGERS IV SOLN
500.0000 mL | Freq: Once | INTRAVENOUS | Status: AC
  Administered 2016-02-26: 500 mL via INTRAVENOUS

## 2016-02-26 MED ORDER — LIDOCAINE HCL (PF) 1 % IJ SOLN
INTRAMUSCULAR | Status: DC | PRN
  Administered 2016-02-26 (×2): 5 mL via EPIDURAL

## 2016-02-26 MED ORDER — ZOLPIDEM TARTRATE 5 MG PO TABS: 5.0000 mg | ORAL_TABLET | Freq: Every evening | ORAL | Status: DC | PRN

## 2016-02-26 MED ORDER — OXYCODONE-ACETAMINOPHEN 5-325 MG PO TABS: 2.0000 | ORAL_TABLET | ORAL | Status: DC | PRN

## 2016-02-26 MED ORDER — TETANUS-DIPHTH-ACELL PERTUSSIS 5-2.5-18.5 LF-MCG/0.5 IM SUSP: 0.5000 mL | Freq: Once | INTRAMUSCULAR | Status: DC

## 2016-02-26 MED ORDER — DIBUCAINE 1 % RE OINT: 1.0000 "application " | TOPICAL_OINTMENT | RECTAL | Status: DC | PRN

## 2016-02-26 MED ORDER — SOD CITRATE-CITRIC ACID 500-334 MG/5ML PO SOLN: 30.0000 mL | ORAL | Status: DC | PRN

## 2016-02-26 MED ORDER — COCONUT OIL OIL: 1.0000 "application " | TOPICAL_OIL | Status: DC | PRN

## 2016-02-26 MED ORDER — WITCH HAZEL-GLYCERIN EX PADS: 1.0000 "application " | MEDICATED_PAD | CUTANEOUS | Status: DC | PRN

## 2016-02-26 MED ORDER — DIPHENHYDRAMINE HCL 50 MG/ML IJ SOLN: 12.5000 mg | INTRAMUSCULAR | Status: DC | PRN

## 2016-02-26 MED ORDER — OXYTOCIN 40 UNITS IN LACTATED RINGERS INFUSION - SIMPLE MED
2.5000 [IU]/h | INTRAVENOUS | Status: DC
  Filled 2016-02-26: qty 1000

## 2016-02-26 MED ORDER — PHENYLEPHRINE 40 MCG/ML (10ML) SYRINGE FOR IV PUSH (FOR BLOOD PRESSURE SUPPORT)
80.0000 ug | PREFILLED_SYRINGE | INTRAVENOUS | Status: DC | PRN
  Filled 2016-02-26: qty 5

## 2016-02-26 MED ORDER — BENZOCAINE-MENTHOL 20-0.5 % EX AERO
1.0000 "application " | INHALATION_SPRAY | CUTANEOUS | Status: DC | PRN
  Administered 2016-02-26: 1 via TOPICAL
  Filled 2016-02-26: qty 56

## 2016-02-26 MED ORDER — SIMETHICONE 80 MG PO CHEW: 80.0000 mg | CHEWABLE_TABLET | ORAL | Status: DC | PRN

## 2016-02-26 NOTE — Anesthesia Pain Management Evaluation Note (Signed)
  CRNA Pain Management Visit Note  Patient: Andrea NordmannKayla Taulbee, 30 y.o., female  "Hello I am a member of the anesthesia team at [facility]. We have an anesthesia team available at all times to provide care throughout the hospital, including epidural management and anesthesia for C-section. I don't know your plan for the delivery whether it a natural birth, water birth, IV sedation, nitrous supplementation, doula or epidural, but we want to meet your pain goals."   1.Was your pain managed to your expectations on prior hospitalizations?   Yes   2.What is your expectation for pain management during this hospitalization?     Epidural  3.How can we help you reach that goal? Epidural in situ.  Record the patient's initial score and the patient's pain goal.   Pain: 0  Pain Goal: 8 [facility] wants you to be able to say your pain was always managed very well.  Hashem Goynes L 02/26/2016

## 2016-02-26 NOTE — Progress Notes (Signed)
LABOR PROGRESS NOTE  Andrea NordmannKayla Silva is a 30 y.o. G5P4004 at 5096w5d  admitted for SOL.  Subjective: Patient doing well. Pain controlled with epidural.   Objective: BP 129/63   Pulse 63   Temp 98.3 F (36.8 C) (Oral)   Resp 20   Ht 5\' 11"  (1.803 m)   Wt 240 lb (108.9 kg)   BMI 33.47 kg/m  or  Vitals:   02/26/16 1122 02/26/16 1330 02/26/16 1336  BP: 147/82 129/63   Pulse: 64 63   Resp: 20 20   Temp: 98.6 F (37 C) 98.3 F (36.8 C)   TempSrc: Oral Oral   Weight:   240 lb (108.9 kg)  Height:   5\' 11"  (1.803 m)    SVE: Dilation: 8 Effacement (%): 80, 90 Cervical Position: Anterior Station: -1 Presentation: Vertex Exam by:: Dr. Nira Retortegele, RESIDENT  Toco: ctx  q2-4 min FHT: baseline rate 145, moderate variability, +acel, no decel  Labs: Lab Results  Component Value Date   WBC 18.2 (H) 02/26/2016   HGB 10.6 (L) 02/26/2016   HCT 30.6 (L) 02/26/2016   MCV 81.2 02/26/2016   PLT 304 02/26/2016    Patient Active Problem List   Diagnosis Date Noted  . Active labor at term 02/26/2016  . NSVD (normal spontaneous vaginal delivery) 03/15/2015    Assessment / Plan: 30 y.o. G5P4004 at 5096w5d here for SOL. GBS status unknown. AROM at 14:40.  Labor: Expectant management  Fetal Wellbeing:  Category I Pain Control:  Epidural Anticipated MOD:  SVD  Frederik PearJulie P Degele, MD 02/26/2016, 2:43 PM

## 2016-02-26 NOTE — Anesthesia Preprocedure Evaluation (Signed)
Anesthesia Evaluation  Patient identified by MRN, date of birth, ID band Patient awake    Reviewed: Allergy & Precautions, NPO status , Patient's Chart, lab work & pertinent test results  Airway Mallampati: III  TM Distance: >3 FB Neck ROM: Full    Dental no notable dental hx. (+) Teeth Intact   Pulmonary former smoker,    Pulmonary exam normal breath sounds clear to auscultation       Cardiovascular negative cardio ROS Normal cardiovascular exam Rhythm:Regular Rate:Normal     Neuro/Psych negative neurological ROS  negative psych ROS   GI/Hepatic Neg liver ROS, GERD  Medicated and Controlled,  Endo/Other  Obesity  Renal/GU negative Renal ROS  negative genitourinary   Musculoskeletal negative musculoskeletal ROS (+)   Abdominal (+) + obese,   Peds  Hematology  (+) anemia ,   Anesthesia Other Findings   Reproductive/Obstetrics (+) Pregnancy                             Lab Results  Component Value Date   WBC 18.2 (H) 02/26/2016   HGB 10.6 (L) 02/26/2016   HCT 30.6 (L) 02/26/2016   MCV 81.2 02/26/2016   PLT 304 02/26/2016     Chemistry      Component Value Date/Time   NA 134 (L) 02/26/2016 1205   K 3.9 02/26/2016 1205   CL 105 02/26/2016 1205   CO2 21 (L) 02/26/2016 1205   BUN 10 02/26/2016 1205   CREATININE 0.60 02/26/2016 1205      Component Value Date/Time   CALCIUM 9.0 02/26/2016 1205   ALKPHOS 153 (H) 02/26/2016 1205   AST 18 02/26/2016 1205   ALT 10 (L) 02/26/2016 1205   BILITOT 0.9 02/26/2016 1205      Anesthesia Physical Anesthesia Plan  ASA: II  Anesthesia Plan: Epidural   Post-op Pain Management:    Induction:   Airway Management Planned: Natural Airway  Additional Equipment:   Intra-op Plan:   Post-operative Plan:   Informed Consent: I have reviewed the patients History and Physical, chart, labs and discussed the procedure including the risks,  benefits and alternatives for the proposed anesthesia with the patient or authorized representative who has indicated his/her understanding and acceptance.     Plan Discussed with: Anesthesiologist  Anesthesia Plan Comments:         Anesthesia Quick Evaluation

## 2016-02-26 NOTE — Anesthesia Procedure Notes (Signed)
Epidural Patient location during procedure: OB Start time: 02/26/2016 2:00 PM  Staffing Anesthesiologist: Mal AmabileFOSTER, Evaristo Tsuda  Preanesthetic Checklist Completed: patient identified, site marked, surgical consent, pre-op evaluation, timeout performed, IV checked, risks and benefits discussed and monitors and equipment checked  Epidural Patient position: sitting Prep: site prepped and draped and DuraPrep Patient monitoring: continuous pulse ox and blood pressure Approach: midline Location: L3-L4 Injection technique: LOR air  Needle:  Needle type: Tuohy  Needle gauge: 17 G Needle length: 9 cm and 9 Needle insertion depth: 8 cm Catheter type: closed end flexible Catheter size: 19 Gauge Catheter at skin depth: 13 cm Test dose: negative and Other  Assessment Events: blood not aspirated, injection not painful, no injection resistance, negative IV test and no paresthesia  Additional Notes Patient identified. Risks and benefits discussed including failed block, incomplete  Pain control, post dural puncture headache, nerve damage, paralysis, blood pressure Changes, nausea, vomiting, reactions to medications-both toxic and allergic and post Partum back pain. All questions were answered. Patient expressed understanding and wished to proceed. Sterile technique was used throughout procedure. Epidural site was Dressed with sterile barrier dressing. No paresthesias, signs of intravascular injection Or signs of intrathecal spread were encountered.  Patient was more comfortable after the epidural was dosed. Please see RN's note for documentation of vital signs and FHR which are stable.

## 2016-02-26 NOTE — MAU Note (Signed)
C/o ucs since 0500 this AM;

## 2016-02-26 NOTE — H&P (Signed)
LABOR AND DELIVERY ADMISSION HISTORY AND PHYSICAL NOTE  Andrea Silva is a 30 y.o. female [phone] with IUP at 9068w5d by LMP presenting for SOL. Patient has received no PNC. Reports only going to the ED around 385-months and had an ultrasound done.  She reports onset of contractions around 5 am this morning, initially every 7-10 minutes, now every 5 minutes. She reports positive fetal movement. She denies leakage of fluid or vaginal bleeding.  She denies any PMH, and reports prior h/o 4 vaginal deliveries with no prenatal or postpartum complications. Reports she wants BTL for contraception, but currently uninsured.   Prenatal History/Complications: no prenatal care  Past Medical History: Past Medical History:  Diagnosis Date  . Medical history non-contributory     Past Surgical History: Past Surgical History:  Procedure Laterality Date  . NO PAST SURGERIES      Obstetrical History: OB History    Gravida Para Term Preterm AB Living   5 4 4     4    SAB TAB Ectopic Multiple Live Births         0 1      Social History: Social History   Social History  . Marital status: Single    Spouse name: N/A  . Number of children: N/A  . Years of education: N/A   Social History Main Topics  . Smoking status: Former Games developermoker  . Smokeless tobacco: Former NeurosurgeonUser  . Alcohol use No  . Drug use: No  . Sexual activity: Yes    Birth control/ protection: None   Other Topics Concern  . None   Social History Narrative  . None    Family History: Family History  Problem Relation Age of Onset  . Anemia Mother   . Alcohol abuse Father   . Hypertension Father   . Thyroid disease Sister   . Diabetes Maternal Grandmother   . Cancer Maternal Grandmother     Allergies: Allergies  Allergen Reactions  . Latex Rash    Prescriptions Prior to Admission  Medication Sig Dispense Refill Last Dose  . acetaminophen (TYLENOL) 325 MG tablet Take 650 mg by mouth every 6 (six) hours as needed for  moderate pain.   02/26/2016 at Unknown time  . calcium carbonate (TUMS - DOSED IN MG ELEMENTAL CALCIUM) 500 MG chewable tablet Chew 1 tablet by mouth daily.   02/26/2016 at Unknown time  . Prenatal Vit-Fe Fumarate-FA (PRENATAL MULTIVITAMIN) TABS tablet Take 1 tablet by mouth daily at 12 noon.    02/26/2016 at Unknown time     Review of Systems  All systems reviewed and negative except as stated in HPI  Blood pressure 147/82, pulse 64, temperature 98.6 F (37 C), temperature source Oral, resp. rate 20, unknown if currently breastfeeding. General appearance: in some distress with contractions Lungs: no respiratory distress Heart: regular rate  Abdomen: soft, non-tender Extremities: No peripheral edema Fetal monitoring: baseline rate 135, mod variability, +acel, occasional variable Uterine activity: q2-4 min Dilation: 5 Effacement (%): 100 Station: -3 Exam by:: Dr. Genevie AnnSchenk   Prenatal labs: ABO, Rh: --/--/B POS (09/05 1205) Antibody: NEG (09/05 1205) Rubella: n/a RPR: Non Reactive (09/22 0815)  HBsAg:   n/a HIV:   n/a GBS: Negative (09/08 0000)  1 hr Glucola: not done Genetic screening:  Not done Anatomy US: not done  Prenatal Transfer Tool  Maternal Diabetes: No Genetic Screening: No PNC Maternal Ultrasounds/Referrals: No PNC Fetal Ultrasounds or other Referrals:  No PNC Maternal Substance Abuse:  No Significant  Maternal Medications:  None Significant Maternal Lab Results: n/a  Results for orders placed or performed during the hospital encounter of 02/26/16 (from the past 24 hour(s))  CBC   Collection Time: 02/26/16 12:05 PM  Result Value Ref Range   WBC 18.2 (H) 4.0 - 10.5 K/uL   RBC 3.77 (L) 3.87 - 5.11 MIL/uL   Hemoglobin 10.6 (L) 12.0 - 15.0 g/dL   HCT 16.1 (L) 09.6 - 04.5 %   MCV 81.2 78.0 - 100.0 fL   MCH 28.1 26.0 - 34.0 pg   MCHC 34.6 30.0 - 36.0 g/dL   RDW 40.9 81.1 - 91.4 %   Platelets 304 150 - 400 K/uL  Differential   Collection Time: 02/26/16 12:05 PM   Result Value Ref Range   Neutrophils Relative % 83 %   Neutro Abs 15.2 (H) 1.7 - 7.7 K/uL   Lymphocytes Relative 12 %   Lymphs Abs 2.2 0.7 - 4.0 K/uL   Monocytes Relative 4 %   Monocytes Absolute 0.7 0.1 - 1.0 K/uL   Eosinophils Relative 1 %   Eosinophils Absolute 0.2 0.0 - 0.7 K/uL   Basophils Relative 0 %   Basophils Absolute 0.0 0.0 - 0.1 K/uL  Comprehensive metabolic panel   Collection Time: 02/26/16 12:05 PM  Result Value Ref Range   Sodium 134 (L) 135 - 145 mmol/L   Potassium 3.9 3.5 - 5.1 mmol/L   Chloride 105 101 - 111 mmol/L   CO2 21 (L) 22 - 32 mmol/L   Glucose, Bld 88 65 - 99 mg/dL   BUN 10 6 - 20 mg/dL   Creatinine, Ser 7.82 0.44 - 1.00 mg/dL   Calcium 9.0 8.9 - 95.6 mg/dL   Total Protein 6.3 (L) 6.5 - 8.1 g/dL   Albumin 2.9 (L) 3.5 - 5.0 g/dL   AST 18 15 - 41 U/L   ALT 10 (L) 14 - 54 U/L   Alkaline Phosphatase 153 (H) 38 - 126 U/L   Total Bilirubin 0.9 0.3 - 1.2 mg/dL   GFR calc non Af Amer >60 >60 mL/min   GFR calc Af Amer >60 >60 mL/min   Anion gap 8 5 - 15  Type and screen [facility]   Collection Time: 02/26/16 12:05 PM  Result Value Ref Range   ABO/RH(D) B POS    Antibody Screen NEG    Sample Expiration 02/29/2016     Patient Active Problem List   Diagnosis Date Noted  . Active labor at term 02/26/2016  . NSVD (normal spontaneous vaginal delivery) 03/15/2015    Assessment: Andrea Silva is a 30 y.o. G5P4004 at [redacted]w[redacted]d with no prenatal care, who presents in active labor. Obese. On EHR review, 15-sec dystocia with last delivery. H/o chlamydia with last pregnancy  #labor: Expectant management #Pain: Pt may have epidural #FWB: Category 1 #ID:  GBS status unknown. Term - no prophylaxis indicated, Rapid PCR ordered #MOF: both #MOC: desires interval BTL #Circ:  Unknown sex #no prental care: Labs drawn in MAU  Kandra Nicolas Degele 02/26/2016, 1:37 PM  OB FELLOW HISTORY AND PHYSICAL ATTESTATION  I have seen and examined this  patient; I agree with above documentation in the resident's note.    Ernestina Penna 02/26/2016, 2:29 PM

## 2016-02-27 LAB — HIV ANTIBODY (ROUTINE TESTING W REFLEX): HIV Screen 4th Generation wRfx: NONREACTIVE

## 2016-02-27 LAB — RUBELLA SCREEN: Rubella: 2.55 index (ref >0.99)

## 2016-02-27 LAB — RPR: RPR Ser Ql: NONREACTIVE

## 2016-02-27 MED ORDER — IBUPROFEN 600 MG PO TABS: 600.0000 mg | ORAL_TABLET | Freq: Four times a day (QID) | ORAL | 0 refills | Status: DC

## 2016-02-27 NOTE — Lactation Note (Signed)
This note was copied from a baby's chart. Lactation Consultation Note  Patient Name: Andrea Julien NordmannKayla Silva ZOXWR'UToday's Date: 02/27/2016 Reason for consult: Follow-up assessment Baby at 23 hr of life. RN reports dyad maybe D/C after 1800. Mom is worried about supply. She stated she did not make enough milk with her older child "but I was using formula and pacifiers". She has a Harmony in the room that she reports knowing how to use. She stated she can manually express. She denies breast or nipple pain. Discussed baby behavior, feeding frequency, baby belly size, voids, wt loss, breast changes, and nipple care. She is aware of OP services and support group. She plans [facility] with [facility] on [facility]/16.    Maternal Data    Feeding Feeding Type: Breast Fed Length of feed: 25 min  LATCH Score/Interventions Latch: Grasps breast easily, tongue down, lips flanged, rhythmical sucking.  Audible Swallowing: Spontaneous and intermittent  Type of Nipple: Everted at rest and after stimulation  Comfort (Breast/Nipple): Soft / non-tender     Hold (Positioning): No assistance needed to correctly position infant at breast.  LATCH Score: 10  Lactation Tools Discussed/Used     Consult Status Consult Status: PRN Follow-up type: Out-patient    Rulon Eisenmengerlizabeth E Kasson Lamere 02/27/2016, 4:03 PM

## 2016-02-27 NOTE — Progress Notes (Signed)
POSTPARTUM PROGRESS NOTE  Post Partum Day 1 Subjective:  Andrea Silva is a 30 y.o. [id number] 2662w5d s/p SVD.  No acute events overnight.  Pt denies problems with ambulating, voiding or po intake.  She denies nausea or vomiting.  Pain is well controlled.  She has had flatus. She has had bowel movement.  Lochia Minimal.   Objective: Blood pressure 130/72, pulse 65, temperature 98.7 F (37.1 C), temperature source Oral, resp. rate 18, height 5\' 11"  (1.803 m), weight 240 lb (108.9 kg), unknown if currently breastfeeding.  Physical Exam:  General: alert, cooperative and no distress Lochia:normal flow Chest: no respiratory distress Heart:regular rate, distal pulses intact Abdomen: soft, nontender,  Uterine Fundus: firm, appropriately tender DVT Evaluation: No calf swelling or tenderness Extremities: trace edema   Recent Labs  02/26/16 1205  HGB 10.6*  HCT 30.6*    Assessment/Plan:  ASSESSMENT: Andrea Silva is a 30 y.o. [id number] 6762w5d s/p SVD  Plan for discharge tomorrow and Breastfeeding   LOS: 1 day   Les Pouicholas SchenkMD 02/27/2016, 7:39 AM

## 2016-02-27 NOTE — Progress Notes (Signed)
Reviewed discharge instructions with patient. Questions answered. Pt verbalized understanding.

## 2016-02-27 NOTE — Progress Notes (Signed)
UR chart review completed.  

## 2016-02-27 NOTE — Discharge Summary (Signed)
OB Discharge Summary     Patient Name: Andrea Silva DOB: 1985-11-13 MRN: [id number]  Date of admission: 02/26/2016 Delivering MD: Frederik Pear   Date of discharge: 02/27/2016  Admitting diagnosis: 39wks, contractions Intrauterine pregnancy: [redacted]w[redacted]d     Secondary diagnosis:  Active Problems:   NSVD (normal spontaneous vaginal delivery)   Active labor at term  Additional problems: none     Discharge diagnosis: Term Pregnancy Delivered                                                                                                Post partum procedures:none  Augmentation: AROM  Complications: None  Hospital course:  Onset of Labor With Vaginal Delivery     30 y.o. yo G5P5005 at [redacted]w[redacted]d was admitted in Active Labor on 02/26/2016. Patient had an uncomplicated labor course as follows:  Membrane Rupture Time/Date: 2:40 PM ,02/26/2016   Intrapartum Procedures: Episiotomy: None [1]                                         Lacerations:  Periurethral [8]  Patient had a delivery of a Viable infant. 02/26/2016  Information for the patient's newborn:  Vivi Martens [[id number]]  Delivery Method: Vag-Spont    Pateint had an uncomplicated postpartum course.  She is ambulating, tolerating a regular diet, passing flatus, and urinating well. Patient is discharged home in stable condition on 02/29/16.    Physical exam Vitals:   02/26/16 1837 02/26/16 2000 02/27/16 0004 02/27/16 1755  BP: 123/68 133/69 130/72 134/74  Pulse: (!) 57 82 65 62  Resp: 20 20 18 18   Temp: 98.2 F (36.8 C) 98.4 F (36.9 C) 98.7 F (37.1 C) 97.9 F (36.6 C)  TempSrc: Oral Oral Oral Oral  Weight:      Height:       General: alert, cooperative and no distress Lochia: appropriate Uterine Fundus: firm Incision: N/A DVT Evaluation: No evidence of DVT seen on physical exam. Labs: Lab Results  Component Value Date   WBC 18.2 (H) 02/26/2016   HGB 10.6 (L) 02/26/2016   HCT 30.6 (L) 02/26/2016   MCV  81.2 02/26/2016   PLT 304 02/26/2016   CMP Latest Ref Rng & Units 02/26/2016  Glucose 65 - 99 mg/dL 88  BUN 6 - 20 mg/dL 10  Creatinine 7.82 - 9.56 mg/dL 2.13  Sodium 086 - 578 mmol/L 134(L)  Potassium 3.5 - 5.1 mmol/L 3.9  Chloride 101 - 111 mmol/L 105  CO2 22 - 32 mmol/L 21(L)  Calcium 8.9 - 10.3 mg/dL 9.0  Total Protein 6.5 - 8.1 g/dL 6.3(L)  Total Bilirubin 0.3 - 1.2 mg/dL 0.9  Alkaline Phos 38 - 126 U/L 153(H)  AST 15 - 41 U/L 18  ALT 14 - 54 U/L 10(L)    Discharge instruction: per After Visit Summary and "Baby and Me Booklet".  After visit meds:    Medication List    TAKE these medications   acetaminophen 325 MG tablet  Commonly known as:  TYLENOL Take 650 mg by mouth every 6 (six) hours as needed for moderate pain.   calcium carbonate 500 MG chewable tablet Commonly known as:  TUMS - dosed in mg elemental calcium Chew 1 tablet by mouth daily.   ibuprofen 600 MG tablet Commonly known as:  ADVIL,MOTRIN Take 1 tablet (600 mg total) by mouth every 6 (six) hours.   prenatal multivitamin Tabs tablet Take 1 tablet by mouth daily at 12 noon.       Diet: routine diet  Activity: Advance as tolerated. Pelvic rest for 6 weeks.   Outpatient follow up:6 weeks Follow up Appt:No future appointments. Follow up Visit:No Follow-up on file.  Postpartum contraception: interval BTL  Newborn Data: Live born female  Birth Weight: 8 lb 3.2 oz (3720 g) APGAR: 8, 9  Baby Feeding: breast and bottle Disposition:home with mother   02/27/2016 Frederik PearJulie P Degele, MD   OB FELLOW DISCHARGE ATTESTATION  I have seen and examined this patient and agree with above documentation in the resident's note.   Jen MowElizabeth Elvis Boot, DO OB Fellow

## 2016-02-27 NOTE — Anesthesia Postprocedure Evaluation (Signed)
Anesthesia Post Note  Patient: Andrea Silva  Procedure(s) Performed: * No procedures listed *  Patient location during evaluation: Mother Baby Anesthesia Type: Epidural Level of consciousness: awake Pain management: satisfactory to patient Vital Signs Assessment: post-procedure vital signs reviewed and stable Respiratory status: spontaneous breathing Cardiovascular status: stable Anesthetic complications: no     Last Vitals:  Vitals:   02/26/16 2000 02/27/16 0004  BP: 133/69 130/72  Pulse: 82 65  Resp: 20 18  Temp: 36.9 C 37.1 C    Last Pain:  Vitals:   02/27/16 0004  TempSrc: Oral  PainSc: 0-No pain   Pain Goal:                 KeyCorpBURGER,Azyah Flett

## 2016-02-27 NOTE — Progress Notes (Signed)
Post Partum Day 1 Subjective: no complaints, up ad lib, voiding, tolerating PO and + flatus. Patient reports feeling well, no headache, changes in vision, or swelling.  She has not yet eaten this morning but had dinner last night. She reports no pain.  Objective: Blood pressure 130/72, pulse 65, temperature 98.7 F (37.1 C), temperature source Oral, resp. rate 18, height 5\' 11"  (1.803 m), weight 108.9 kg (240 lb), unknown if currently breastfeeding.  Physical Exam:  General: alert, cooperative and no distress Lochia: appropriate Uterine Fundus: firm DVT Evaluation: No evidence of DVT seen on physical exam.   Recent Labs  02/26/16 1205  HGB 10.6*  HCT 30.6*    Assessment/Plan: Plan for discharge tomorrow, Lactation consult for this morning. Contraception of choice is BTL, patient will be given papers before leaving. Briefly discussed alternatives in the meantime, patient is interested in more information regarding this. Baby will get an outpatient circumcision.   LOS: 1 day   Andrea MoralesSharon Silva 02/27/2016, 7:17 AM    OB FELLOW MEDICAL STUDENT NOTE ATTESTATION  I have seen and examined this patient. Note this is a Psychologist, occupationalmedical student note and as such does not necessarily reflect the patient's plan of care. Please see Andrea Pennaicholas Kodee Drury MD's note for this date of service.    Andrea Silva 02/27/2016, 7:42 AM

## 2016-02-27 NOTE — Progress Notes (Addendum)
CLINICAL SOCIAL WORK MATERNAL/CHILD NOTE  Patient Details  Name: Andrea Silva MRN: [id number] Date of Birth: 02/26/2014  Date:  02/27/2016  Clinical Social Worker Initiating Note:  Lilly Cove, LCSW            Date/ Time Initiated:  02/27/16/1028                        Child's Name:  Andrea Silva   Legal Guardian:  Mother   Need for Interpreter:  None   Date of Referral:        Reason for Referral:  Other (Comment) (No PNC)   Referral Source:  RN   Address:     Phone number:      Household Members: Spouse, Minor Children   Natural Supports (not living in the home): Extended Family, Friends   Arts administrator, Organized support group (Comment) (MOB reports she has a lady come to her house every other week and brings diapers, books and checks other children's developmental)   Employment:Unemployed   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  LCSW met with MOB alone in room to complete assessment. MOB was very open and honest in assessment regarding no prenatal care. She reports she had her Medicaid cut off in July (MOB had pregnancy medicaid and did not know she was pregnant until around 5 months) and Medicaid lapsed. She reports she did reapply, but it was only a couple weeks ago so she did not follow up with OB. MOB reports this was not a planned pregnancy as this is her fifth and final. She  reports she has other children 9, 5,2,1 and newborn. She reports all are active with care and have Medicaid and go to [facility] off of [address]. She reports her 28 year old has some speech delays and has been receiving development/speech therapy in the outpatient setting. She reports she use to work, but now stays home with her children and husband works. She is a former Therapist, sports in [place].  Family moved here in 2016 due to finances and previously living in [place].  MOB did work with CSW in last pregnancy (2016) due again to no prenatal care as she was moving and reports she was too far along to receive services. She reports she did use THC in last pregnancy and did use THC in this pregnancy until she found out she was pregnancy in April.  Reports she has not used any substances since finding out. In last pregnancy she did test positive with baby's meconium and CPS report was made. She reports she worked her plan and it was closed in 90 days. She still has a case worker come to her home every other week, but cannot remember her name or what agency she is with. Most likely it will be [place] who is following case and LCSW will find out. LCSW explained hospital policy and following cord. If positive there will be a CPS report made. MOB  voiced understanding and policy.  She denied other needs at this time for baby and in the home. She plans to take baby to [facility] for Pediatrician, however thinking of switching care to [facility].  MOB will add baby to her food stamps and also apply for baby [facility] and made appointment.  No other needs at this time. No barriers to DC. Will call [place] to see if they are involved. Will follow cord and make report if positive.   CSW Plan/Description: Information/Referral to [employer] , [employer] , No Further Intervention Required/No Barriers to Discharge (Will make CPS report if cord is positive for substances)   LCSW called [facility] and MOB  is open with services which will continue after DC.  Georgia Duff is her Insurance underwriter.  LCSW has called CPS to understand if MOB did have a case opened with last pregnancy.  Message left and will follow up. (NO barriers to DC, just documentation purposes for calling CPS, no CPS report made.)   Lilly Cove, LCSW 02/27/2016, 10:30 AM

## 2016-02-27 NOTE — Discharge Instructions (Signed)

## 2016-02-27 NOTE — Lactation Note (Signed)
This note was copied from a baby's chart. Lactation Consultation Note Experienced BF mom has a 28 yr old that she BF and formula/bottle fed for 2-4 months. Mom didn't BF her other children. Mom is Breast and formula/bottle feeding. Mom has soft compressible nipples. Hand expression taught w/colostrum noted. Mom states baby is latching well. Baby is sleeping in moms arms when entered RM. Mom encouraged to feed baby 8-12 times/24 hours and with feeding cues. Referred to Baby and Me Book in Breastfeeding section Pg. 22-23 for position options and Proper latch demonstration. WH/LC brochure given w/resources, support groups and LC services.  Patient Name: Andrea Silva ZOXWR'UToday's Date: 02/27/2016 Reason for consult: Initial assessment   Maternal Data Has patient been taught Hand Expression?: Yes Does the patient have breastfeeding experience prior to this delivery?: Yes  Feeding Feeding Type: Breast Fed Length of feed: 15 min  LATCH Score/Interventions       Type of Nipple: Everted at rest and after stimulation  Comfort (Breast/Nipple): Soft / non-tender     Intervention(s): Breastfeeding basics reviewed;Support Pillows;Position options;Skin to skin     Lactation Tools Discussed/Used WIC Program: Yes   Consult Status Consult Status: Follow-up Date: 02/28/16 Follow-up type: In-patient    Chancellor Vanderloop, Diamond NickelLAURA G 02/27/2016, 7:30 AM

## 2016-02-28 LAB — CULTURE, BETA STREP (GROUP B ONLY)

## 2016-02-28 LAB — HEPATITIS B SURFACE ANTIGEN: HEP B S AG: NEGATIVE

## 2017-09-22 ENCOUNTER — Other Ambulatory Visit: Payer: Self-pay | Admitting: Internal Medicine

## 2017-09-22 ENCOUNTER — Ambulatory Visit
Admission: RE | Admit: 2017-09-22 | Discharge: 2017-09-22 | Disposition: A | Discharge status: Home / Self Care | Payer: No Typology Code available for payment source | Source: Ambulatory Visit | Attending: Internal Medicine | Admitting: Internal Medicine

## 2017-09-22 DIAGNOSIS — A150 Tuberculosis of lung: Secondary | ICD-10-CM

## 2018-02-02 ENCOUNTER — Emergency Department (HOSPITAL_COMMUNITY)
Admission: EM | Admit: 2018-02-02 | Discharge: 2018-02-03 | Disposition: A | Discharge status: Home / Self Care | Payer: Medicaid Other | Attending: Emergency Medicine | Admitting: Emergency Medicine

## 2018-02-02 ENCOUNTER — Encounter (HOSPITAL_COMMUNITY): Payer: Self-pay | Admitting: Emergency Medicine

## 2018-02-02 ENCOUNTER — Other Ambulatory Visit: Payer: Self-pay

## 2018-02-02 ENCOUNTER — Emergency Department (HOSPITAL_COMMUNITY): Payer: Medicaid Other

## 2018-02-02 DIAGNOSIS — Y9389 Activity, other specified: Secondary | ICD-10-CM | POA: Insufficient documentation

## 2018-02-02 DIAGNOSIS — X500XXA Overexertion from strenuous movement or load, initial encounter: Secondary | ICD-10-CM | POA: Insufficient documentation

## 2018-02-02 DIAGNOSIS — Y990 Civilian activity done for income or pay: Secondary | ICD-10-CM | POA: Insufficient documentation

## 2018-02-02 DIAGNOSIS — Z79899 Other long term (current) drug therapy: Secondary | ICD-10-CM | POA: Insufficient documentation

## 2018-02-02 DIAGNOSIS — S6981XA Other specified injuries of right wrist, hand and finger(s), initial encounter: Secondary | ICD-10-CM | POA: Diagnosis present

## 2018-02-02 DIAGNOSIS — Z91040 Latex allergy status: Secondary | ICD-10-CM | POA: Diagnosis not present

## 2018-02-02 DIAGNOSIS — S62624A Displaced fracture of medial phalanx of right ring finger, initial encounter for closed fracture: Secondary | ICD-10-CM | POA: Insufficient documentation

## 2018-02-02 DIAGNOSIS — Z87891 Personal history of nicotine dependence: Secondary | ICD-10-CM | POA: Insufficient documentation

## 2018-02-02 DIAGNOSIS — S62629A Displaced fracture of medial phalanx of unspecified finger, initial encounter for closed fracture: Secondary | ICD-10-CM

## 2018-02-02 DIAGNOSIS — Y929 Unspecified place or not applicable: Secondary | ICD-10-CM | POA: Diagnosis not present

## 2018-02-02 LAB — I-STAT BETA HCG BLOOD, ED (MC, WL, AP ONLY): I-stat hCG, quantitative: <5 m[IU]/mL (ref <5)

## 2018-02-02 NOTE — ED Triage Notes (Signed)
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  Pt c/o pain in right ring finger

## 2018-02-02 NOTE — ED Provider Notes (Signed)
[facility]   CSN: [id number] Arrival date & time: 02/02/18  2106     History   Chief Complaint Chief Complaint  Patient presents with  . Finger Injury    HPI Andrea Silva is a 32 y.o. female presenting for pain of her right ring finger that began yesterday while she was at work.  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  Patient states that she has taken some Tylenol for her pain with some relief.  Patient denies numbness, tingling, weakness or color change of the finger.  Patient states that she has been able to move her finger but with increased pain.  Patient states that she is otherwise healthy denies history of chronic medical conditions.  HPI  Past Medical History:  Diagnosis Date  . Medical history non-contributory     Patient Active Problem List   Diagnosis Date Noted  . Active labor at term 02/26/2016  . NSVD (normal spontaneous vaginal delivery) 03/15/2015    Past Surgical History:  Procedure Laterality Date  . NO PAST SURGERIES       OB History    Gravida  5   Para  5   Term  5   Preterm      AB      Living  5     SAB      TAB      Ectopic      Multiple  0   Live Births  2            Home Medications    Prior to Admission medications   Medication Sig Start Date End Date Taking? Authorizing Provider  acetaminophen (TYLENOL) 325 MG tablet Take 650 mg by mouth every 6 (six) hours as needed for moderate pain.    [provider]  calcium carbonate (TUMS - DOSED IN MG ELEMENTAL CALCIUM) 500 MG chewable tablet Chew 1 tablet by mouth daily.    [provider]  ibuprofen (ADVIL,MOTRIN) 600 MG tablet Take 1 tablet (600 mg total) by mouth every 6 (six) hours. 02/27/16   Degele, Kandra NicolasJulie P, MD  Prenatal Vit-Fe Fumarate-FA (PRENATAL  MULTIVITAMIN) TABS tablet Take 1 tablet by mouth daily at 12 noon.     [provider]    Family History Family History  Problem Relation Age of Onset  . Anemia Mother   . Alcohol abuse Father   . Hypertension Father   . Thyroid disease Sister   . Diabetes Maternal Grandmother   . Cancer Maternal Grandmother     Social History Social History   Tobacco Use  . Smoking status: Former Games developermoker  . Smokeless tobacco: Former Engineer, waterUser  Substance Use Topics  . Alcohol use: No  . Drug use: No     Allergies   Latex   Review of Systems Review of Systems  Constitutional: Negative.  Negative for chills, fatigue and fever.  Musculoskeletal: Positive for arthralgias. Negative for joint swelling.  Skin: Negative.  Negative for color change and wound.     Physical Exam Updated Vital Signs BP 120/67   Pulse (!) 57   Temp 99.2 F (37.3 C) (Oral)   Resp 18   Ht 5\' 10"  (1.778 m)   Wt 106.6 kg   LMP 01/26/2018 (Exact Date)   SpO2 100%   BMI 33.72 kg/m  Physical Exam  Constitutional: She is oriented to person, place, and time. She appears well-developed and well-nourished. No distress.  HENT:  Head: Normocephalic and atraumatic.  Right Ear: External ear normal.  Left Ear: External ear normal.  Nose: Nose normal.  Eyes: Pupils are equal, round, and reactive to light. EOM are normal.  Neck: Trachea normal and normal range of motion. No tracheal deviation present.  Pulmonary/Chest: Effort normal. No respiratory distress.  Musculoskeletal: Normal range of motion.       Right hand: She exhibits tenderness. She exhibits normal range of motion, normal two-point discrimination, normal capillary refill, no deformity and no swelling. Normal sensation noted. Normal strength noted.       Hands: Right hand: No gross deformities, skin intact. Fingers appear normal.  Tenderness to palpation over PIP of the Right ring finger. Pain increased with movement. No snuffbox tenderness to  palpation. No tenderness to palpation over flexor sheath.  Finger adduction/abduction intact with 5/5 strength.  Thumb opposition intact. Full active and resisted ROM to flexion/extension at wrist, MCP, PIP and DIP of all fingers.  FDS/FDP intact. Grip 5/5 strength.  Radial artery 2+ with <2sec cap refill in all fingers.  Sensation intact to light-tough in median/ulnar/radial distributions.   Neurological: She is alert and oriented to person, place, and time. No sensory deficit.  Skin: Skin is warm and dry. Capillary refill takes less than 2 seconds.  Psychiatric: She has a normal mood and affect. Her behavior is normal.     ED Treatments / Results  Labs (all labs ordered are listed, but only abnormal results are displayed) Labs Reviewed  I-STAT BETA HCG BLOOD, ED ([facility], [facility], [facility] ONLY)    EKG None  Radiology Dg Finger Ring Right  Result Date: 02/02/2018 CLINICAL DATA:  Fourth digit pain following pulling injury, initial encounter EXAM: RIGHT RING FINGER 3V COMPARISON:  None. FINDINGS: Mild soft tissue swelling is noted. There is a minimal avulsion at the base of the fourth middle phalanx. No other focal bony abnormality is seen. IMPRESSION: Avulsion at the base of the fourth middle phalanx. Some associated soft tissue changes are noted. Electronically Signed   By: Alcide CleverMark  Lukens M.D.   On: 02/02/2018 22:11    Procedures Procedures (including critical care time)  Medications Ordered in ED Medications - No data to display   Initial Impression / Assessment and Plan / ED Course  I have reviewed the triage vital signs and the nursing notes.  Pertinent labs & imaging results that were available during my care of the patient were reviewed by me and considered in my medical decision making (see chart for details).   Patient presenting with pain to the right fourth finger around the PIP joint after injury at work yesterday.  Imaging today shows avulsion of the base of the fourth middle  phalanx.  Patient is finger has been splinted here by emergency department staff.  Patient states that she does not want narcotic pain medication and she is only wanting to use over-the-counter ibuprofen and Tylenol for her pain.  Patient has intact sensation to light touch to all of her fingers, capillary refill and intact, patient has flexion and extension intact to the finger.  Patient is able to move the finger in all directions with increased pain.  I have given the patient referral to hand Ortho for a follow-up appointment.  I have also encouraged the patient to follow-up with her primary care provider regarding her today.  At this time there  does not appear to be any evidence of an acute emergency medical condition and the patient appears stable for discharge with appropriate outpatient follow up. Diagnosis was discussed with patient who verbalizes understanding of care plan and is agreeable to discharge. I have discussed return precautions with patient who verbalizes understanding of return precautions. Patient strongly encouraged to follow-up with their PCP. All questions answered.  Patient's case discussed with Dr. Estell Harpin who agrees with plan to discharge with follow-up.     Note: Portions of this report may have been transcribed using voice recognition software. Every effort was made to ensure accuracy; however, inadvertent computerized transcription errors may still be present.   Final Clinical Impressions(s) / ED Diagnoses   Final diagnoses:  Closed avulsion fracture of middle phalanx of finger, initial encounter    ED Discharge Orders    None       Elizabeth Palau 02/03/18 Elder Love, MD 02/04/18 1228

## 2018-02-02 NOTE — Discharge Instructions (Addendum)
Please follow-up with [facility] as soon as you can regarding your finger fracture. Please also follow-up with your primary care provider regarding your visit today. You may continue to use over-the-counter anti-inflammatories such as Tylenol and ibuprofen as directed on the packaging for pain.  You may also use rest, ice, compression and elevation to help with your pain. Please return to the emergency department for any new or worsening symptoms.  Contact a health care provider if: Your pain or swelling gets worse even with treatment. You have trouble moving your finger. Get help right away if: Your finger becomes numb or blue.  RESOURCE GUIDE  Chronic Pain Problems: Contact [facility]  [phone] Patients need to be referred by their primary care doctor.  Insufficient Money for Medicine: Contact United Way:  call "211" or [facility] [phone].  No Primary Care Doctor: Call Health Connect  [phone] - can help you locate a primary care doctor that  accepts your insurance, provides certain services, etc. Physician Referral Service- [phone]  Agencies that provide inexpensive medical care: [facility]  [phone] [facility]  [phone] [facility]  [phone] [facility]  [phone] Planned [facility]  [phone] [facility]  [phone]  Medicaid-accepting [facility] Providers: [facility]- [address], [address]  [phone], Mon-Fri 9am-7pm, Sat 9am-1pm [facility]- [address], [address] [place]  [phone] [facility]- [address], [address] [place]  [phone] [facility]- [address] [place] [address]  [phone] Renaye RakersVeita Bland- [address], [address], [phone]  Only accepts [place] patients after they have their name  applied to their card  Self Pay (no insurance) in [place] [place]: Sickle Cell Patients: Dr Willey BladeEric Dean, [facility]  [address], [phone] [facility]- [address]  [phone]       Redge Gainer-     [facility] [place]- [address], [address]       -     [facility]- see information above (Speak to CitigroupPam H if you do not have insurance)       -  [facility]- [address], [phone]       -  [facility]- [address],  [phone]       -  [facility]- [address] [place] [address], [phone]       -  Dr Julio Sickssei-Bonsu-  [address], [address], [place], [phone]       -  [facility]- [address], [phone]       -  [facility] [place]- [address] [place] [address], [phone], also [address], [phone]       -    [facility]- [address] [place], [phone], 1st & 3rd Saturday   every month, 10am-1pm  1) Find a Doctor and Pay Out of Pocket Although you won't have to find out who is covered by your insurance plan, it is a good idea to ask around and get recommendations. You will then need to call the office and see if the doctor you have chosen will accept you as a new patient and what types of options they offer for patients who are self-pay. Some doctors offer discounts or will set up payment plans for their patients  who do not have insurance, but you will need to ask so you aren't surprised when you get to your appointment.  2) Contact Your Local Health Department Not all health departments have doctors that can see patients for sick visits, but many do, so it is worth a call to see if yours does. If you don't know where your local health department is, you can check in your phone book. The CDC also has a tool to help you locate your state's health department, and many state websites also have listings of all of their local health departments.  3) Find a Walk-in Clinic If your illness is not likely to be very severe or complicated, you may  want to try a walk in clinic. These are popping up all over the country in pharmacies, drugstores, and shopping centers. They're usually staffed by nurse practitioners or physician assistants that have been trained to treat common illnesses and complaints. They're usually fairly quick and inexpensive. However, if you have serious medical issues or chronic medical problems, these are probably not your best option  STD Testing [facility] Department of [facility], STD Clinic, [address], [place], phone [phone] or [phone].  Monday - Friday, call for an appointment. [facility] Department of [facility] [place], STD Clinic, [place], [place], phone [phone] or [phone].  Monday - Friday, call for an appointment.  Abuse/Neglect: [facility] Child Abuse Hotline [phone] [facility] Child Abuse Hotline [phone] (After Hours)  Emergency Shelter:  [facility] [phone]  Maternity Homes: Room at the [facility] [phone] [facility] AlertFl[facility] Services [phone]  MRSA Hotline #:   845-472-4818(930) 484-2910  [facility] Resources  [facility]  United Way [facility]. [address]                 [address]         [address]  [place]                                               [place]                              [place] Phone:  [phone]                                  Phone:  [phone]                   Phone:  [phone]  [facility], [phone] [facility] Services - [facility]- [phone]       -     [facility] in [place], [address],                                  [phone], [facility]  [place] Child Abuse Hotline 567-273-3048(336) 415-135-9362 or [phone]) [phone] (After Hours)   Behavioral Health Services  Substance Abuse Resources: Alcohol and Drug  Services  [phone] [facility] [phone] [facility] [phone] [facility] [phone] Residential & Outpatient Substance Abuse Program  [phone]  Psychological Services: [facility]  [phone] [facility]  [phone] [facility], [phone] [place]. [address] [place], ACCESS LINE: [phone] or [phone], EntrepreneurLoan.[email or web address]  Dental Assistance  If unable to pay or uninsured, contact:  [facility] or [facility]. to become qualified for the adult dental clinic.  Patients with Medicaid: [facility] [phone] [address], [phone] [address] [phone]  If unable to pay, or uninsured, contact [facility] [phone]) or [facility] Department [phone] in [place], [phone] in [facility]) to become qualified for the adult dental clinic   Other Low-Cost Community Dental Services: [facility]- [address] [place], [place], [place], [address], [phone], Ext. [phone], 2nd and 4th Thursday of the month at 6:30am.  10 clients each day by appointment, can sometimes see walk-in patients if someone does not show for an appointment. [facility]- [address] [place], [place], [address], [phone] [facility] [address], [place], [place], [address], [phone] [facility] Health Department- [phone] [facility] Department- [phone] [facility] [phone]

## 2018-07-03 ENCOUNTER — Emergency Department (HOSPITAL_COMMUNITY)
Admission: EM | Admit: 2018-07-03 | Discharge: 2018-07-03 | Disposition: A | Discharge status: Home / Self Care | Payer: Medicaid Other | Attending: Emergency Medicine | Admitting: Emergency Medicine

## 2018-07-03 ENCOUNTER — Encounter: Payer: Self-pay | Admitting: Emergency Medicine

## 2018-07-03 DIAGNOSIS — R6889 Other general symptoms and signs: Secondary | ICD-10-CM

## 2018-07-03 DIAGNOSIS — Z87891 Personal history of nicotine dependence: Secondary | ICD-10-CM | POA: Diagnosis not present

## 2018-07-03 DIAGNOSIS — Z79899 Other long term (current) drug therapy: Secondary | ICD-10-CM | POA: Insufficient documentation

## 2018-07-03 DIAGNOSIS — J101 Influenza due to other identified influenza virus with other respiratory manifestations: Secondary | ICD-10-CM | POA: Insufficient documentation

## 2018-07-03 DIAGNOSIS — R05 Cough: Secondary | ICD-10-CM | POA: Diagnosis present

## 2018-07-03 LAB — GROUP A STREP BY PCR: GROUP A STREP BY PCR: NOT DETECTED

## 2018-07-03 MED ORDER — ALBUTEROL SULFATE HFA 108 (90 BASE) MCG/ACT IN AERS: 1.0000 | INHALATION_SPRAY | Freq: Four times a day (QID) | RESPIRATORY_TRACT | 0 refills | Status: DC | PRN

## 2018-07-03 MED ORDER — LIDOCAINE VISCOUS HCL 2 % MT SOLN: 15.0000 mL | OROMUCOSAL | 0 refills | Status: DC | PRN

## 2018-07-03 NOTE — Discharge Instructions (Signed)
Use inhaler as needed for wheezing and shortness of breath Use lidocaine solution for sore throat Continue Tylenol, Delsym and Mucinex Please rest and drink plenty of fluids Return if worsening

## 2018-07-03 NOTE — ED Provider Notes (Signed)
[facility] EMERGENCY DEPARTMENT Provider Note   CSN: [id number] Arrival date & time: 07/03/18  1736     History   Chief Complaint Chief Complaint  Patient presents with  . Illness  . Cough    HPI Andrea Silva is a 33 y.o. female who presents with URI symptoms and a cough.  No significant past medical history.  The patient states for the past 2 days she has had a sore throat, headache, and a dry cough.  She is possibly had fevers at home but has not checked her temperature.  She also has been having fullness of her here particularly her right ear.  She has had a lot of congestion and her back hurts when she coughs.  She has been getting lightheaded with standing despite trying to stay hydrated. Her daughter is with her and was seen in the [facility] ED and clinically diagnosed with flu and was given prescription for Tamiflu.  The patient has been taking Mucinex and Delsym for her chest congestion and cough.  She has not had a flu shot this year.  She works in a nursing home. HPI  Past Medical History:  Diagnosis Date  . Medical history non-contributory     Patient Active Problem List   Diagnosis Date Noted  . Active labor at term 02/26/2016  . NSVD (normal spontaneous vaginal delivery) 03/15/2015    Past Surgical History:  Procedure Laterality Date  . NO PAST SURGERIES       OB History    Gravida  5   Para  5   Term  5   Preterm      AB      Living  5     SAB      TAB      Ectopic      Multiple  0   Live Births  2            Home Medications    Prior to Admission medications   Medication Sig Start Date End Date Taking? Authorizing Provider  acetaminophen (TYLENOL) 325 MG tablet Take 650 mg by mouth every 6 (six) hours as needed for moderate pain.    [provider]  calcium carbonate (TUMS - DOSED IN MG ELEMENTAL CALCIUM) 500 MG chewable tablet Chew 1 tablet by mouth daily.    [provider]  ibuprofen  (ADVIL,MOTRIN) 600 MG tablet Take 1 tablet (600 mg total) by mouth every 6 (six) hours. 02/27/16   Degele, Kandra Nicolas, MD  Prenatal Vit-Fe Fumarate-FA (PRENATAL MULTIVITAMIN) TABS tablet Take 1 tablet by mouth daily at 12 noon.     [provider]    Family History Family History  Problem Relation Age of Onset  . Anemia Mother   . Alcohol abuse Father   . Hypertension Father   . Thyroid disease Sister   . Diabetes Maternal Grandmother   . Cancer Maternal Grandmother     Social History Social History   Tobacco Use  . Smoking status: Former Games developer  . Smokeless tobacco: Former Engineer, water Use Topics  . Alcohol use: No  . Drug use: No     Allergies   Latex   Review of Systems Review of Systems  Constitutional: Positive for fever.  HENT: Positive for congestion, ear pain, rhinorrhea and sore throat.   Respiratory: Positive for cough. Negative for shortness of breath.   Cardiovascular: Negative for chest pain.  Musculoskeletal: Positive for back pain (with coughing) and  myalgias.     Physical Exam Updated Vital Signs BP 131/77 (BP Location: Right Arm)   Pulse 100   Temp 98.3 F (36.8 C) (Oral)   Resp 18   Ht 5\' 11"  (1.803 m)   Wt 117.9 kg   LMP 06/29/2018   SpO2 97%   BMI 36.26 kg/m   Physical Exam Vitals signs and nursing note reviewed.  Constitutional:      General: She is not in acute distress.    Appearance: She is well-developed. She is obese.     Comments: Calm and cooperative. Appears mildly ill.  HENT:     Head: Normocephalic and atraumatic.     Right Ear: A middle ear effusion is present.     Left Ear: A middle ear effusion is present.     Nose: Nose normal.     Mouth/Throat:     Lips: Pink.     Mouth: Mucous membranes are moist.     Tongue: No lesions.     Pharynx: Oropharynx is clear.  Eyes:     General: No scleral icterus.       Right eye: No discharge.        Left eye: No discharge.     Conjunctiva/sclera: Conjunctivae normal.       Pupils: Pupils are equal, round, and reactive to light.  Neck:     Musculoskeletal: Normal range of motion.  Cardiovascular:     Rate and Rhythm: Normal rate and regular rhythm.  Pulmonary:     Effort: Pulmonary effort is normal. No respiratory distress.     Breath sounds: Normal breath sounds.  Abdominal:     General: There is no distension.  Skin:    General: Skin is warm and dry.  Neurological:     Mental Status: She is alert and oriented to person, place, and time.  Psychiatric:        Behavior: Behavior normal.      ED Treatments / Results  Labs (all labs ordered are listed, but only abnormal results are displayed) Labs Reviewed  GROUP A STREP BY PCR    EKG None  Radiology No results found.  Procedures Procedures (including critical care time)  Medications Ordered in ED Medications - No data to display   Initial Impression / Assessment and Plan / ED Course  I have reviewed the triage vital signs and the nursing notes.  Pertinent labs & imaging results that were available during my care of the patient were reviewed by me and considered in my medical decision making (see chart for details).  34 year old female presents with URI symptoms and cough.  Her vital signs are normal here.  On exam she appears mildly ill.  Her daughter was clinically diagnosed with fluid at the pediatric ED.  She has a middle ear effusion, erythema of the throat.  Heart is regular rate and rhythm and lungs are clear to auscultation.  She mainly is complaining of a sore throat.  We will make sure she does not have strep and reassess.  Strep is negative.  She is already taking several over-the-counter medicines.  She reports some chest tightness and states she got relief from using her mother's nebulizer last night.  We will give her an inhaler and some viscous lidocaine for her sore throat.  She was also offered Tamiflu and declined.  She was advised to return if worsening.  Final  Clinical Impressions(s) / ED Diagnoses   Final diagnoses:  Flu-like symptoms  ED Discharge Orders    None       Beryle QuantGekas, Kelly Marie, PA-C 07/03/18 2042    Terrilee FilesButler, Michael C, MD 07/04/18 1023

## 2018-07-03 NOTE — ED Notes (Signed)
Pt stable, ambulatory, states understanding of discharge instructions 

## 2018-07-03 NOTE — ED Triage Notes (Signed)
Pt c/o flu like symptoms since Thursday evening.  Daughter at bedside was treated for the flu as well.

## 2018-07-03 NOTE — ED Notes (Signed)
Called Microbiology lab and talked to [place] who states the test hasn't resulted yet and it could take another 30-minutes to an hour.

## 2019-06-24 NOTE — L&D Delivery Note (Addendum)
OB/GYN Faculty Practice Delivery Note  Andrea Silva is a 34 y.o. [id number] s/p SVD at [redacted]w[redacted]d. She was admitted for SOL/IOL due to postdates.   ROM: 5h 71m with clear fluid GBS Status:  Positive/-- (09/07 0000), received Ampicillin  Maximum Maternal Temperature: 98.59F  Labor Progress: Initial SVE: 5/80/-3. She then progressed to complete.   Delivery Date/Time: 9/16 at 1301  Delivery: Called to room and patient was complete and pushing. Head delivered direct OA. No nuchal cord present. Initially had difficulty delivering anterior shoulder, with suprapubic pressure and McRobert's maneuver, Dr. Macon Large was able to delivery the shoulders and body after 45 seconds. Infant with spontaneous cry, placed on mother's abdomen, dried and stimulated. Cord clamped x 2 after 1-minute delay, and cut by mother. Cord blood drawn. Placenta delivered spontaneously with gentle cord traction. Fundus firm with massage and Pitocin. Labia, perineum, vagina, and cervix inspected inspected with no lacerations.  Baby Weight: pending  Placenta: Sent to L&D Complications: Suspected placental abruption immediately prior to delivery  Lacerations: None EBL: 630 mL Analgesia: Epidural  Infant:  APGAR (1 MIN): 8   APGAR (5 MINS): 9    Andrea Silva  Family Medicine PGY-3  03/08/2020, 4:20 PM   Attestation of Supervision of Student:  I confirm that I have verified the information documented in the medical student's note and that I have also personally  supervised  the history, physical exam and all medical decision making activities.  I have verified that all services and findings are accurately documented in this student's note; and I agree with management and plan as outlined in the documentation. I have also made any necessary editorial changes.  Bernerd Limbo, CNM Center for [employer], [facility] 03/08/2020 5:49 PM

## 2019-08-23 ENCOUNTER — Other Ambulatory Visit: Payer: Self-pay

## 2019-08-23 ENCOUNTER — Inpatient Hospital Stay (HOSPITAL_COMMUNITY)
Admission: AD | Admit: 2019-08-23 | Discharge: 2019-08-23 | Disposition: A | Discharge status: Home / Self Care | Payer: Medicaid Other | Attending: Family Medicine | Admitting: Family Medicine

## 2019-08-23 ENCOUNTER — Encounter (HOSPITAL_COMMUNITY): Payer: Self-pay

## 2019-08-23 ENCOUNTER — Inpatient Hospital Stay (HOSPITAL_COMMUNITY): Payer: Medicaid Other

## 2019-08-23 DIAGNOSIS — R1030 Lower abdominal pain, unspecified: Secondary | ICD-10-CM | POA: Diagnosis present

## 2019-08-23 DIAGNOSIS — R109 Unspecified abdominal pain: Secondary | ICD-10-CM

## 2019-08-23 DIAGNOSIS — O26891 Other specified pregnancy related conditions, first trimester: Secondary | ICD-10-CM | POA: Diagnosis not present

## 2019-08-23 DIAGNOSIS — Z87891 Personal history of nicotine dependence: Secondary | ICD-10-CM | POA: Insufficient documentation

## 2019-08-23 DIAGNOSIS — Z3A12 12 weeks gestation of pregnancy: Secondary | ICD-10-CM | POA: Diagnosis not present

## 2019-08-23 DIAGNOSIS — R42 Dizziness and giddiness: Secondary | ICD-10-CM | POA: Insufficient documentation

## 2019-08-23 DIAGNOSIS — Z3491 Encounter for supervision of normal pregnancy, unspecified, first trimester: Secondary | ICD-10-CM | POA: Insufficient documentation

## 2019-08-23 DIAGNOSIS — Z8616 Personal history of COVID-19: Secondary | ICD-10-CM | POA: Diagnosis not present

## 2019-08-23 DIAGNOSIS — E669 Obesity, unspecified: Secondary | ICD-10-CM | POA: Insufficient documentation

## 2019-08-23 DIAGNOSIS — Z6835 Body mass index (BMI) 35.0-35.9, adult: Secondary | ICD-10-CM | POA: Diagnosis not present

## 2019-08-23 DIAGNOSIS — R110 Nausea: Secondary | ICD-10-CM | POA: Diagnosis not present

## 2019-08-23 DIAGNOSIS — R102 Pelvic and perineal pain: Secondary | ICD-10-CM

## 2019-08-23 LAB — LIPASE, BLOOD: Lipase: 22 U/L (ref 11–51)

## 2019-08-23 LAB — URINALYSIS, ROUTINE W REFLEX MICROSCOPIC
Bacteria, UA: NONE SEEN
Bilirubin Urine: NEGATIVE
Glucose, UA: NEGATIVE mg/dL
Ketones, ur: NEGATIVE mg/dL
Nitrite: NEGATIVE
Protein, ur: NEGATIVE mg/dL
Specific Gravity, Urine: 1.013 (ref 1.005–1.030)
pH: 7 (ref 5.0–8.0)

## 2019-08-23 LAB — COMPREHENSIVE METABOLIC PANEL
ALT: 23 U/L (ref 0–44)
AST: 18 U/L (ref 15–41)
Albumin: 3.6 g/dL (ref 3.5–5.0)
Alkaline Phosphatase: 56 U/L (ref 38–126)
Anion gap: 9 (ref 5–15)
BUN: 9 mg/dL (ref 6–20)
CO2: 23 mmol/L (ref 22–32)
Calcium: 9.7 mg/dL (ref 8.9–10.3)
Chloride: 104 mmol/L (ref 98–111)
Creatinine, Ser: 0.57 mg/dL (ref 0.44–1.00)
GFR calc Af Amer: >60 mL/min (ref >60)
GFR calc non Af Amer: >60 mL/min (ref >60)
Glucose, Bld: 108 mg/dL — ABNORMAL HIGH (ref 70–99)
Potassium: 3.2 mmol/L — ABNORMAL LOW (ref 3.5–5.1)
Sodium: 136 mmol/L (ref 135–145)
Total Bilirubin: 0.6 mg/dL (ref 0.3–1.2)
Total Protein: 7 g/dL (ref 6.5–8.1)

## 2019-08-23 LAB — HCG, QUANTITATIVE, PREGNANCY: hCG, Beta Chain, Quant, S: 72348 m[IU]/mL — ABNORMAL HIGH (ref <5)

## 2019-08-23 LAB — CBC
HCT: 36.7 % (ref 36.0–46.0)
Hemoglobin: 12.4 g/dL (ref 12.0–15.0)
MCH: 30.8 pg (ref 26.0–34.0)
MCHC: 33.8 g/dL (ref 30.0–36.0)
MCV: 91.3 fL (ref 80.0–100.0)
Platelets: 284 10*3/uL (ref 150–400)
RBC: 4.02 MIL/uL (ref 3.87–5.11)
RDW: 12.4 % (ref 11.5–15.5)
WBC: 13.9 10*3/uL — ABNORMAL HIGH (ref 4.0–10.5)
nRBC: 0 % (ref 0.0–0.2)

## 2019-08-23 LAB — I-STAT BETA HCG BLOOD, ED (MC, WL, AP ONLY): I-stat hCG, quantitative: >2000 m[IU]/mL — ABNORMAL HIGH (ref <5)

## 2019-08-23 MED ORDER — SODIUM CHLORIDE 0.9 % IV BOLUS
1000.0000 mL | Freq: Once | INTRAVENOUS | Status: AC
  Administered 2019-08-23: 1000 mL via INTRAVENOUS

## 2019-08-23 NOTE — ED Provider Notes (Addendum)
I saw and evaluated the patient, reviewed the resident's note and I agree with the findings and plan.  EKG:    Andrea Silva is a 34 y.o. female G6, P5 here presenting with lower abdominal pain.  Patient states that her last menstrual period was sometime in December.  She can exactly recall because she has irregular periods.  Some dizziness today and has lower abdominal pain as well.  Patient's i-STAT hCG was positive in triage.  Patient has mild diffuse lower abdominal tenderness bedside ultrasound showed possible right ectopic.  I talked to Dr. Adrian Blackwater OB at women's.  He will see patient at [facility] and will get formal ultrasound there to evaluate    EMERGENCY DEPARTMENT [place] PREGNANCY "Study: Limited Ultrasound of the Pelvis for Pregnancy"  INDICATIONS:Pregnancy(required) Multiple views of the uterus and pelvic cavity were obtained in real-time with a multi-frequency probe.  APPROACH:Transabdominal  PERFORMED BY: Myself IMAGES ARCHIVED?: Yes LIMITATIONS: Body habitus PREGNANCY FREE FLUID:  ADNEXAL FINDINGS:R ectopic with FHR GESTATIONAL AGE, ESTIMATE:  FETAL HEART RATE: 130 INTERPRETATION: Extrauterine gestational sac noted   CRITICAL CARE Performed by: Richardean Canal   Total critical care time: 30 minutes  Critical care time was exclusive of separately billable procedures and treating other patients.  Critical care was necessary to treat or prevent imminent or life-threatening deterioration.  Critical care was time spent personally by me on the following activities: development of treatment plan with patient and/or surrogate as well as nursing, discussions with consultants, evaluation of patient's response to treatment, examination of patient, obtaining history from patient or surrogate, ordering and performing treatments and interventions, ordering and review of laboratory studies, ordering and review of radiographic studies, pulse oximetry and re-evaluation of patient's  condition.      Charlynne Pander, MD 08/23/19 2050    Charlynne Pander, MD 08/23/19 2050    Charlynne Pander, MD 08/23/19 2222

## 2019-08-23 NOTE — MAU Provider Note (Signed)
Chief Complaint: Abdominal Pain and Dizziness   First Provider Initiated Contact with Patient 08/23/19 2232     SUBJECTIVE HPI: Andrea Silva is a 34 y.o. G6P5005 at [redacted]w[redacted]d who presents to Maternity Admissions reporting abdominal pain. She was seen at [facility] this evening for her symptoms & had a BSUS performed by the EDP that was concerning for ectopic pregnancy. She was sent to MAU for an official ob ultrasound. States she just found out today that she's pregnant & has irregular periods so is unsure how far along she is. Has had nausea since this morning & has felt light headed. Also reports lower abdominal cramping. Denies fever, vaginal bleeding, vaginal discharge, or dysuria.   Location: abdomen Quality: cramping Severity: currently 0/10 on pain scale Duration: 1 day Timing: intermittent Modifying factors: none Associated signs and symptoms: nausea, lightheaded  Past Medical History:  Diagnosis Date  . Medical history non-contributory    OB History  Gravida Para Term Preterm AB Living  6 5 5     5   SAB TAB Ectopic Multiple Live Births        0 2    # Outcome Date GA Lbr Len/2nd Weight Sex Delivery Anes PTL Lv  6 Current           5 Term 02/26/16 [redacted]w[redacted]d 11:50 / 00:08 3720 g M Vag-Spont EPI  LIV     Birth Comments: none  4 Term 03/15/15 105w2d 12:53 / 00:23 3780 g F Vag-Spont EPI  LIV  3 Term           2 Term           1 Term            Past Surgical History:  Procedure Laterality Date  . NO PAST SURGERIES     Social History   Socioeconomic History  . Marital status: Single    Spouse name: Not on file  . Number of children: Not on file  . Years of education: Not on file  . Highest education level: Not on file  Occupational History  . Not on file  Tobacco Use  . Smoking status: Former [redacted]w[redacted]d  . Smokeless tobacco: Former Games developer and Sexual Activity  . Alcohol use: No  . Drug use: No  . Sexual activity: Yes    Birth control/protection: None  Other Topics  Concern  . Not on file  Social History Narrative  . Not on file   Social Determinants of Health   Financial Resource Strain:   . Difficulty of Paying Living Expenses: Not on file  Food Insecurity:   . Worried About Engineer, water in the Last Year: Not on file  . Ran Out of Food in the Last Year: Not on file  Transportation Needs:   . Lack of Transportation (Medical): Not on file  . Lack of Transportation (Non-Medical): Not on file  Physical Activity:   . Days of Exercise per Week: Not on file  . Minutes of Exercise per Session: Not on file  Stress:   . Feeling of Stress : Not on file  Social Connections:   . Frequency of Communication with Friends and Family: Not on file  . Frequency of Social Gatherings with Friends and Family: Not on file  . Attends Religious Services: Not on file  . Active Member of Clubs or Organizations: Not on file  . Attends Programme researcher, broadcasting/film/video Meetings: Not on file  . Marital Status: Not on file  Intimate Partner Violence:   . Fear of Current or Ex-Partner: Not on file  . Emotionally Abused: Not on file  . Physically Abused: Not on file  . Sexually Abused: Not on file   Family History  Problem Relation Age of Onset  . Anemia Mother   . Alcohol abuse Father   . Hypertension Father   . Thyroid disease Sister   . Diabetes Maternal Grandmother   . Cancer Maternal Grandmother    No current facility-administered medications on file prior to encounter.   Current Outpatient Medications on File Prior to Encounter  Medication Sig Dispense Refill  . acetaminophen (TYLENOL) 325 MG tablet Take 650 mg by mouth every 6 (six) hours as needed for moderate pain.    Marland Kitchen albuterol (PROVENTIL HFA;VENTOLIN HFA) 108 (90 Base) MCG/ACT inhaler Inhale 1-2 puffs into the lungs every 6 (six) hours as needed for wheezing or shortness of breath. 1 Inhaler 0  . calcium carbonate (TUMS - DOSED IN MG ELEMENTAL CALCIUM) 500 MG chewable tablet Chew 1 tablet by mouth daily.     . Prenatal Vit-Fe Fumarate-FA (PRENATAL MULTIVITAMIN) TABS tablet Take 1 tablet by mouth daily at 12 noon.      Allergies  Allergen Reactions  . Latex Rash    I have reviewed patient's Past Medical Hx, Surgical Hx, Family Hx, Social Hx, medications and allergies.   Review of Systems  Constitutional: Negative.   Gastrointestinal: Positive for abdominal pain and nausea. Negative for constipation, diarrhea and vomiting.  Genitourinary: Negative.   Neurological: Positive for light-headedness. Negative for syncope.    OBJECTIVE Patient Vitals for the past 24 hrs:  BP Temp Temp src Pulse Resp SpO2 Height Weight  08/23/19 2305 132/70 -- -- 92 -- -- -- --  08/23/19 2015 103/66 -- -- 82 -- 100 % -- --  08/23/19 1720 (!) 131/92 99.5 F (37.5 C) Oral 87 18 100 % 5' 10.5" (1.791 m) 113.4 kg   Constitutional: Well-developed, well-nourished female in no acute distress.  Cardiovascular: normal rate & rhythm, no murmur Respiratory: normal rate and effort. Lung sounds clear throughout GI: Abd soft, non-tender, Pos BS x 4. No guarding or rebound tenderness MS: Extremities nontender, no edema, normal ROM Neurologic: Alert and oriented x 4.      LAB RESULTS Results for orders placed or performed during the hospital encounter of 08/23/19 (from the past 24 hour(s))  Lipase, blood     Status: None   Collection Time: 08/23/19  5:55 PM  Result Value Ref Range   Lipase 22 11 - 51 U/L  Comprehensive metabolic panel     Status: Abnormal   Collection Time: 08/23/19  5:55 PM  Result Value Ref Range   Sodium 136 135 - 145 mmol/L   Potassium 3.2 (L) 3.5 - 5.1 mmol/L   Chloride 104 98 - 111 mmol/L   CO2 23 22 - 32 mmol/L   Glucose, Bld 108 (H) 70 - 99 mg/dL   BUN 9 6 - 20 mg/dL   Creatinine, Ser 2.42 0.44 - 1.00 mg/dL   Calcium 9.7 8.9 - 35.3 mg/dL   Total Protein 7.0 6.5 - 8.1 g/dL   Albumin 3.6 3.5 - 5.0 g/dL   AST 18 15 - 41 U/L   ALT 23 0 - 44 U/L   Alkaline Phosphatase 56 38 - 126 U/L    Total Bilirubin 0.6 0.3 - 1.2 mg/dL   GFR calc non Af Amer >60 >60 mL/min   GFR calc Af Amer >60 >  60 mL/min   Anion gap 9 5 - 15  CBC     Status: Abnormal   Collection Time: 08/23/19  5:55 PM  Result Value Ref Range   WBC 13.9 (H) 4.0 - 10.5 K/uL   RBC 4.02 3.87 - 5.11 MIL/uL   Hemoglobin 12.4 12.0 - 15.0 g/dL   HCT 36.7 36.0 - 46.0 %   MCV 91.3 80.0 - 100.0 fL   MCH 30.8 26.0 - 34.0 pg   MCHC 33.8 30.0 - 36.0 g/dL   RDW 12.4 11.5 - 15.5 %   Platelets 284 150 - 400 K/uL   nRBC 0.0 0.0 - 0.2 %  hCG, quantitative, pregnancy     Status: Abnormal   Collection Time: 08/23/19  5:55 PM  Result Value Ref Range   hCG, Beta Chain, Quant, S 72,348 (H) <5 mIU/mL  I-Stat beta hCG blood, ED     Status: Abnormal   Collection Time: 08/23/19  5:57 PM  Result Value Ref Range   I-stat hCG, quantitative >2,000.0 (H) <5 mIU/mL   Comment 3          Urinalysis, Routine w reflex microscopic     Status: Abnormal   Collection Time: 08/23/19  7:55 PM  Result Value Ref Range   Color, Urine YELLOW YELLOW   APPearance CLEAR CLEAR   Specific Gravity, Urine 1.013 1.005 - 1.030   pH 7.0 5.0 - 8.0   Glucose, UA NEGATIVE NEGATIVE mg/dL   Hgb urine dipstick MODERATE (A) NEGATIVE   Bilirubin Urine NEGATIVE NEGATIVE   Ketones, ur NEGATIVE NEGATIVE mg/dL   Protein, ur NEGATIVE NEGATIVE mg/dL   Nitrite NEGATIVE NEGATIVE   Leukocytes,Ua TRACE (A) NEGATIVE   RBC / HPF 0-5 0 - 5 RBC/hpf   WBC, UA 0-5 0 - 5 WBC/hpf   Bacteria, UA NONE SEEN NONE SEEN   Squamous Epithelial / LPF 0-5 0 - 5    IMAGING US OB Comp Less 14 Wks  Result Date: 08/23/2019 CLINICAL DATA:  Diffuse pelvic pain EXAM: OBSTETRIC <14 WK ULTRASOUND TECHNIQUE: Transabdominal ultrasound was performed for evaluation of the gestation as well as the maternal uterus and adnexal regions. COMPARISON:  None. FINDINGS: Intrauterine gestational sac: Single Yolk sac:  Not Visualized. Embryo:  Visualized. Cardiac Activity: Visualized. Heart Rate: 159 bpm CRL:  65.1 mm   12 w 6 d                  [place] EDC: 02/29/2020 Subchorionic hemorrhage:  None visualized. Maternal uterus/adnexae: Ovaries are within normal limits. Right ovary measures 3.8 x 2.7 x 2.7 cm. The left ovary measures 4.4 x 3.1 x 3.2 cm. No significant free fluid. IMPRESSION: Single viable intrauterine pregnancy as above. No specific abnormality is seen. Electronically Signed   By: Donavan Foil M.D.   On: 08/23/2019 22:14    MAU COURSE Orders Placed This Encounter  Procedures  . Culture, OB Urine  . US OB Comp Less 14 Wks  . Lipase, blood  . Comprehensive metabolic panel  . CBC  . Urinalysis, Routine w reflex microscopic  . hCG, quantitative, pregnancy  . Diet NPO time specified  . Saline Lock IV, Maintain IV access  . I-Stat beta hCG blood, ED  . Discharge patient   Meds ordered this encounter  Medications  . sodium chloride 0.9 % bolus 1,000 mL    MDM Ultrasound through MAU shows live IUP measuring [redacted]w[redacted]d, EDD updated.  Pt reassured. Denies lightheadedness at this time & minimal cramping.  Ready to be discharged home. Would like list of local OB providers.   ASSESSMENT 1. Abdominal pain during pregnancy in first trimester   2. Pelvic pain   3. Normal IUP (intrauterine pregnancy) on prenatal ultrasound, first trimester   4. [redacted] weeks gestation of pregnancy     PLAN Discharge home in stable condition. Discussed reasons to return to MAU Start prenatal care  Allergies as of 08/23/2019      Reactions   Latex Rash      Medication List    STOP taking these medications   ibuprofen 600 MG tablet Commonly known as: ADVIL   lidocaine 2 % solution Commonly known as: XYLOCAINE     TAKE these medications   acetaminophen 325 MG tablet Commonly known as: TYLENOL Take 650 mg by mouth every 6 (six) hours as needed for moderate pain.   albuterol 108 (90 Base) MCG/ACT inhaler Commonly known as: VENTOLIN HFA Inhale 1-2 puffs into the lungs every 6 (six) hours as needed for  wheezing or shortness of breath.   calcium carbonate 500 MG chewable tablet Commonly known as: TUMS - dosed in mg elemental calcium Chew 1 tablet by mouth daily.   prenatal multivitamin Tabs tablet Take 1 tablet by mouth daily at 12 noon.        Judeth Horn, NP 08/24/2019  1:44 AM

## 2019-08-23 NOTE — Discharge Instructions (Signed)
Prenatal Care Providers           [facility] @ [place]   Phone: [phone]  Center for [facility] @ [place]   Phone: [phone]  [facility] @[place]       Phone: [phone]            [facility] for [facility] @ [place]     Phone: [phone]          [facility] for [facility] @ [facility]   Phone: [phone]  [facility] for [facility] @ [facility]  Phone: [phone]  Center for [facility] @ [facility] Phone: [phone]     [facility]  Phone: [phone]  [facility]  Phone: [phone]  [phone] OB/GYN Phone: [phone]  [facility] Phone: [phone]  [facility] Phone: [phone]  [facility] Phone: [phone]  [facility]  Phone: [phone]

## 2019-08-23 NOTE — MAU Note (Signed)
NOT IN LOBBY 

## 2019-08-23 NOTE — ED Triage Notes (Signed)
Pt arrives POV for eval of abd pain, nausea and dizzinesss onset this AM. Reports she feels weak and lightheaded. Denies known covid contacts, denies fevers./chills

## 2019-08-23 NOTE — ED Provider Notes (Signed)
[facility] EMERGENCY DEPARTMENT Provider Note   CSN: [id number] Arrival date & time: 08/23/19  1711     History Chief Complaint  Patient presents with  . Abdominal Pain  . Dizziness    Andrea Silva is a 34 y.o. female.  Andrea Silva is a 34 year old female G6P5 with a past medical history significant for obesity and COVID-19 infection in December who presents with nausea and abodiminal for the past day. The patient states her abdominal pain and nausea started yesterday.  The pain is located in her lower abdomen and radiates up her sides bilaterally and her lower back.  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 She states that she went to work this morning, but nausea and abdominal pain increased throughout the day. She went home to rest, but the pain and nausea continued to worsen.  She is currently describing 9 out of 10 pain. She is not taking any medications to relieve the pain.  I-STAT beta-hCG >2000. Patient's last menstrual period was in December. She states that they are irregular and tend to happen every 2 to 3 months.  No issues with her previous pregnancies, her children are living       Past Medical History:  Diagnosis Date  . Medical history non-contributory     Patient Active Problem List   Diagnosis Date Noted  . Active labor at term 02/26/2016  . NSVD (normal spontaneous vaginal delivery) 03/15/2015    Past Surgical History:  Procedure Laterality Date  . NO PAST SURGERIES      OB History    Gravida  5   Para  5   Term  5   Preterm      AB      Living  5     SAB      TAB      Ectopic      Multiple  0   Live Births  2          Family History  Problem Relation Age of Onset  . Anemia Mother   . Alcohol abuse Father   . Hypertension Father   . Thyroid disease Sister   . Diabetes Maternal Grandmother   . Cancer Maternal Grandmother    Social History   Tobacco Use  .  Smoking status: Former Games developer  . Smokeless tobacco: Former Engineer, water Use Topics  . Alcohol use: No  . Drug use: No   Home Medications Prior to Admission medications   Medication Sig Start Date End Date Taking? Authorizing Provider  acetaminophen (TYLENOL) 325 MG tablet Take 650 mg by mouth every 6 (six) hours as needed for moderate pain.    [provider]  albuterol (PROVENTIL HFA;VENTOLIN HFA) 108 (90 Base) MCG/ACT inhaler Inhale 1-2 puffs into the lungs every 6 (six) hours as needed for wheezing or shortness of breath. 07/03/18   Bethel Born, PA-C  calcium carbonate (TUMS - DOSED IN MG ELEMENTAL CALCIUM) 500 MG chewable tablet Chew 1 tablet by mouth daily.    [provider]  ibuprofen (ADVIL,MOTRIN) 600 MG tablet Take 1 tablet (600 mg total) by mouth every 6 (six) hours. 02/27/16   Degele, Kandra Nicolas, MD  lidocaine (XYLOCAINE) 2 % solution Use as directed 15 mLs in the mouth or throat as needed for mouth pain. 07/03/18   Bethel Born, PA-C  Prenatal Vit-Fe Fumarate-FA (PRENATAL MULTIVITAMIN) TABS tablet Take 1 tablet by mouth daily at  12 noon.     [provider]    Allergies    Latex  Review of Systems   Review of Systems  Constitutional: Positive for fatigue. Negative for appetite change and fever.  HENT: Negative.   Eyes: Negative.   Respiratory: Positive for shortness of breath. Negative for cough and chest tightness.   Cardiovascular: Negative.   Gastrointestinal: Positive for abdominal pain (Lower abdomen) and nausea. Negative for vomiting.  Endocrine: Negative.   Genitourinary: Negative.   Musculoskeletal: Negative.   Neurological: Positive for dizziness (Scribes it as if her head is swimming) and light-headedness.  Hematological: Negative.   Psychiatric/Behavioral: Negative.     Physical Exam Updated Vital Signs BP (!) 131/92 (BP Location: Right Arm)   Pulse 87   Temp 99.5 F (37.5 C) (Oral)   Resp 18   Ht 5' 10.5" (1.791  m)   Wt 113.4 kg   SpO2 100%   BMI 35.36 kg/m   Physical Exam Vitals reviewed.  Constitutional:      General: She is not in acute distress.    Appearance: She is obese. She is not ill-appearing, toxic-appearing or diaphoretic.  HENT:     Head: Normocephalic and atraumatic.  Cardiovascular:     Rate and Rhythm: Normal rate and regular rhythm.     Heart sounds: No murmur. No friction rub. No gallop.   Pulmonary:     Effort: Pulmonary effort is normal. No respiratory distress.     Breath sounds: Normal breath sounds. No stridor. No wheezing or rhonchi.  Chest:     Chest wall: No tenderness.  Abdominal:     General: Bowel sounds are normal. There is no distension or abdominal bruit. There are no signs of injury.     Tenderness: There is abdominal tenderness in the right lower quadrant and left lower quadrant.  Genitourinary:    Uterus: Tender.   Neurological:     General: No focal deficit present.     Mental Status: She is alert.  Psychiatric:     Comments: Tearful    ED Results / Procedures / Treatments   Labs (all labs ordered are listed, but only abnormal results are displayed) Labs Reviewed  COMPREHENSIVE METABOLIC PANEL - Abnormal; Notable for the following components:      Result Value   Potassium 3.2 (*)    Glucose, Bld 108 (*)    All other components within normal limits  CBC - Abnormal; Notable for the following components:   WBC 13.9 (*)    All other components within normal limits  I-STAT BETA HCG BLOOD, ED ([facility], [facility], [facility] ONLY) - Abnormal; Notable for the following components:   I-stat hCG, quantitative >2,000.0 (*)    All other components within normal limits  LIPASE, BLOOD  URINALYSIS, ROUTINE W REFLEX MICROSCOPIC    EKG None  Radiology No results found.    Medications Ordered in ED Medications - No data to display  ED Course  I have reviewed the triage vital signs and the nursing notes.  Pertinent labs & imaging results that were available  during my care of the patient were reviewed by me and considered in my medical decision making (see chart for details).    MDM Rules/Calculators/A&P                      Bedside ultrasound demonstrated what appeared to be an ectopic pregnancy.  We will call OB/GYN and have the patient transferred to the women's  hospital.  Final Clinical Impression(s) / ED Diagnoses Final diagnoses:  None    Rx / DC Orders ED Discharge Orders    None       Kirt Boys, MD 08/23/19 2159    Charlynne Pander, MD 08/23/19 2220

## 2019-08-25 LAB — CULTURE, OB URINE

## 2020-02-28 ENCOUNTER — Other Ambulatory Visit: Payer: Self-pay | Admitting: Obstetrics and Gynecology

## 2020-02-28 ENCOUNTER — Other Ambulatory Visit: Payer: Self-pay

## 2020-02-28 ENCOUNTER — Inpatient Hospital Stay (HOSPITAL_COMMUNITY)
Admission: AD | Admit: 2020-02-28 | Discharge: 2020-02-28 | Disposition: A | Discharge status: Home / Self Care | Payer: Medicaid Other | Attending: Obstetrics & Gynecology | Admitting: Obstetrics & Gynecology

## 2020-02-28 ENCOUNTER — Encounter (HOSPITAL_COMMUNITY): Payer: Self-pay | Admitting: Obstetrics & Gynecology

## 2020-02-28 DIAGNOSIS — O0943 Supervision of pregnancy with grand multiparity, third trimester: Secondary | ICD-10-CM | POA: Insufficient documentation

## 2020-02-28 DIAGNOSIS — O479 False labor, unspecified: Secondary | ICD-10-CM

## 2020-02-28 DIAGNOSIS — O471 False labor at or after 37 completed weeks of gestation: Secondary | ICD-10-CM

## 2020-02-28 DIAGNOSIS — Z3A39 39 weeks gestation of pregnancy: Secondary | ICD-10-CM | POA: Diagnosis not present

## 2020-02-28 LAB — CBC
HCT: 33.7 % — ABNORMAL LOW (ref 36.0–46.0)
Hemoglobin: 11.1 g/dL — ABNORMAL LOW (ref 12.0–15.0)
MCH: 29.4 pg (ref 26.0–34.0)
MCHC: 32.9 g/dL (ref 30.0–36.0)
MCV: 89.2 fL (ref 80.0–100.0)
Platelets: 306 10*3/uL (ref 150–400)
RBC: 3.78 MIL/uL — ABNORMAL LOW (ref 3.87–5.11)
RDW: 13 % (ref 11.5–15.5)
WBC: 14 10*3/uL — ABNORMAL HIGH (ref 4.0–10.5)
nRBC: 0 % (ref 0.0–0.2)

## 2020-02-28 LAB — RAPID URINE DRUG SCREEN, HOSP PERFORMED
Amphetamines: NOT DETECTED
Barbiturates: NOT DETECTED
Benzodiazepines: NOT DETECTED
Cocaine: NOT DETECTED
Opiates: NOT DETECTED
Tetrahydrocannabinol: NOT DETECTED

## 2020-02-28 LAB — HEPATITIS C ANTIBODY: HCV Ab: NONREACTIVE

## 2020-02-28 LAB — OB RESULTS CONSOLE GBS: GBS: POSITIVE

## 2020-02-28 LAB — HEPATITIS B SURFACE ANTIGEN: Hepatitis B Surface Ag: NONREACTIVE

## 2020-02-28 LAB — HEMOGLOBIN A1C
Hgb A1c MFr Bld: 5.5 % (ref 4.8–5.6)
Mean Plasma Glucose: 111.15 mg/dL

## 2020-02-28 LAB — HIV ANTIBODY (ROUTINE TESTING W REFLEX): HIV Screen 4th Generation wRfx: NONREACTIVE

## 2020-02-28 NOTE — Progress Notes (Signed)
Admission orders placed for post-dates IOL on 03/07/20. Of note, pt with no prenatal care. Seen in MAU on 9/7 at [redacted]w[redacted]d.

## 2020-02-28 NOTE — Progress Notes (Signed)
Patient seen in MAU as a labor evaluation. At time of discharge, patient reports she has had no prenatal care & doesn't have any upcoming appointments. As she will unlikely be able to be seen before 41 wks, she will be set up for a 41 wk induction.  Prenatal labs & vaginal swabs collected in MAU prior to her discharge.   Judeth Horn, NP

## 2020-02-28 NOTE — MAU Provider Note (Signed)
S: Ms. Retia Cordle is a 34 y.o. G6P5005 at [redacted]w[redacted]d  who presents to MAU today for labor evaluation.     Cervical exam by RN:  Dilation: 1.5 Effacement (%): Thick Cervical Position: Posterior Station: -3 Exam by:: jolynn  Fetal Monitoring: Baseline: 150 Variability: mod var Accelerations: present Decelerations: occasional  Contractions: one contraction  MDM Discussed patient with RN. NST reviewed.   A: SIUP at [redacted]w[redacted]d  False labor  P: Discharge home Labor precautions and kick counts included in AVS Patient to follow-up with  OB gyn   Patient may return to MAU as needed or when in labor   Marylene Land, CNM 02/28/2020 5:32 PM

## 2020-02-28 NOTE — Discharge Instructions (Signed)
Third Trimester of Pregnancy  The third trimester is from week 28 through week 40 (months 7 through 9). This trimester is when your unborn baby (fetus) is growing very fast. At the end of the ninth month, the unborn baby is about 20 inches in length. It weighs about 6-10 pounds. Follow these instructions at home: Medicines  Take over-the-counter and prescription medicines only as told by your doctor. Some medicines are safe and some medicines are not safe during pregnancy.  Take a prenatal vitamin that contains at least 600 micrograms (mcg) of folic acid.  If you have trouble pooping (constipation), take medicine that will make your stool soft (stool softener) if your doctor approves. Eating and drinking   Eat regular, healthy meals.  Avoid raw meat and uncooked cheese.  If you get low calcium from the food you eat, talk to your doctor about taking a daily calcium supplement.  Eat four or five small meals rather than three large meals a day.  Avoid foods that are high in fat and sugars, such as fried and sweet foods.  To prevent constipation: ? Eat foods that are high in fiber, like fresh fruits and vegetables, whole grains, and beans. ? Drink enough fluids to keep your pee (urine) clear or pale yellow. Activity  Exercise only as told by your doctor. Stop exercising if you start to have cramps.  Avoid heavy lifting, wear low heels, and sit up straight.  Do not exercise if it is too hot, too humid, or if you are in a place of great height (high altitude).  You may continue to have sex unless your doctor tells you not to. Relieving pain and discomfort  Wear a good support bra if your breasts are tender.  Take frequent breaks and rest with your legs raised if you have leg cramps or low back pain.  Take warm water baths (sitz baths) to soothe pain or discomfort caused by hemorrhoids. Use hemorrhoid cream if your doctor approves.  If you develop puffy, bulging veins (varicose  veins) in your legs: ? Wear support hose or compression stockings as told by your doctor. ? Raise (elevate) your feet for 15 minutes, 3-4 times a day. ? Limit salt in your food. Safety  Wear your seat belt when driving.  Make a list of emergency phone numbers, including numbers for family, friends, the hospital, and police and fire departments. Preparing for your baby's arrival To prepare for the arrival of your baby:  Take prenatal classes.  Practice driving to the hospital.  Visit the hospital and tour the maternity area.  Talk to your work about taking leave once the baby comes.  Pack your hospital bag.  Prepare the baby's room.  Go to your doctor visits.  Buy a rear-facing car seat. Learn how to install it in your car. General instructions  Do not use hot tubs, steam rooms, or saunas.  Do not use any products that contain nicotine or tobacco, such as cigarettes and e-cigarettes. If you need help quitting, ask your doctor.  Do not drink alcohol.  Do not douche or use tampons or scented sanitary pads.  Do not cross your legs for long periods of time.  Do not travel for long distances unless you must. Only do so if your doctor says it is okay.  Visit your dentist if you have not gone during your pregnancy. Use a soft toothbrush to brush your teeth. Be gentle when you floss.  Avoid cat litter boxes and soil   used by cats. These carry germs that can cause birth defects in the baby and can cause a loss of your baby (miscarriage) or stillbirth.  Keep all your prenatal visits as told by your doctor. This is important. Contact a doctor if:  You are not sure if you are in labor or if your water has broken.  You are dizzy.  You have mild cramps or pressure in your lower belly.  You have a nagging pain in your belly area.  You continue to feel sick to your stomach, you throw up, or you have watery poop.  You have bad smelling fluid coming from your vagina.  You have  pain when you pee. Get help right away if:  You have a fever.  You are leaking fluid from your vagina.  You are spotting or bleeding from your vagina.  You have severe belly cramps or pain.  You lose or gain weight quickly.  You have trouble catching your breath and have chest pain.  You notice sudden or extreme puffiness (swelling) of your face, hands, ankles, feet, or legs.  You have not felt the baby move in over an hour.  You have severe headaches that do not go away with medicine.  You have trouble seeing.  You are leaking, or you are having a gush of fluid, from your vagina before you are 37 weeks.  You have regular belly spasms (contractions) before you are 37 weeks. Summary  The third trimester is from week 28 through week 40 (months 7 through 9). This time is when your unborn baby is growing very fast.  Follow your doctor's advice about medicine, food, and activity.  Get ready for the arrival of your baby by taking prenatal classes, getting all the baby items ready, preparing the baby's room, and visiting your doctor to be checked.  Get help right away if you are bleeding from your vagina, or you have chest pain and trouble catching your breath, or if you have not felt your baby move in over an hour. This information is not intended to replace advice given to you by your health care provider. Make sure you discuss any questions you have with your health care provider. Document Revised: 09/30/2018 Document Reviewed: 07/15/2016 Elsevier Patient Education  2020 [employer] of the uterus can occur throughout pregnancy, but they are not always a sign that you are in labor. You may have practice contractions called Braxton Hicks contractions. These false labor contractions are sometimes confused with true labor. What are Deberah Pelton contractions? Braxton Hicks contractions are tightening movements that occur in the muscles of  the uterus before labor. Unlike true labor contractions, these contractions do not result in opening (dilation) and thinning of the cervix. Toward the end of pregnancy (32-34 weeks), Braxton Hicks contractions can happen more often and may become stronger. These contractions are sometimes difficult to tell apart from true labor because they can be very uncomfortable. You should not feel embarrassed if you go to the hospital with false labor. Sometimes, the only way to tell if you are in true labor is for your health care provider to look for changes in the cervix. The health care provider will do a physical exam and may monitor your contractions. If you are not in true labor, the exam should show that your cervix is not dilating and your water has not broken. If there are no other health problems associated with your pregnancy, it is completely safe  completely safe for you to be sent home with false labor. You may continue to have Braxton Hicks contractions until you go into true labor. How to tell the difference between true labor and false labor True labor  Contractions last 30-70 seconds.  Contractions become very regular.  Discomfort is usually felt in the top of the uterus, and it spreads to the lower abdomen and low back.  Contractions do not go away with walking.  Contractions usually become more intense and increase in frequency.  The cervix dilates and gets thinner. False labor  Contractions are usually shorter and not as strong as true labor contractions.  Contractions are usually irregular.  Contractions are often felt in the front of the lower abdomen and in the groin.  Contractions may go away when you walk around or change positions while lying down.  Contractions get weaker and are shorter-lasting as time goes on.  The cervix usually does not dilate or become thin. Follow these instructions at home:   Take over-the-counter and prescription medicines only as told by your health care  provider.  Keep up with your usual exercises and follow other instructions from your health care provider.  Eat and drink lightly if you think you are going into labor.  If Braxton Hicks contractions are making you uncomfortable: ? Change your position from lying down or resting to walking, or change from walking to resting. ? Sit and rest in a tub of warm water. ? Drink enough fluid to keep your urine pale yellow. Dehydration may cause these contractions. ? Do slow and deep breathing several times an hour.  Keep all follow-up prenatal visits as told by your health care provider. This is important. Contact a health care provider if:  You have a fever.  You have continuous pain in your abdomen. Get help right away if:  Your contractions become stronger, more regular, and closer together.  You have fluid leaking or gushing from your vagina.  You pass blood-tinged mucus (bloody show).  You have bleeding from your vagina.  You have low back pain that you never had before.  You feel your baby's head pushing down and causing pelvic pressure.  Your baby is not moving inside you as much as it used to. Summary  Contractions that occur before labor are called Braxton Hicks contractions, false labor, or practice contractions.  Braxton Hicks contractions are usually shorter, weaker, farther apart, and less regular than true labor contractions. True labor contractions usually become progressively stronger and regular, and they become more frequent.  Manage discomfort from Braxton Hicks contractions by changing position, resting in a warm bath, drinking plenty of water, or practicing deep breathing. This information is not intended to replace advice given to you by your health care provider. Make sure you discuss any questions you have with your health care provider. Document Revised: 05/22/2017 Document Reviewed: 10/23/2016 Elsevier Patient Education  2020 Elsevier Inc.  

## 2020-02-28 NOTE — MAU Note (Signed)
Started contracting last night, now every 3-4.  Denies bleeding or leaking. No recent exams.  No problems with preg.

## 2020-02-29 ENCOUNTER — Other Ambulatory Visit: Payer: Self-pay | Admitting: Advanced Practice Midwife

## 2020-02-29 LAB — RPR: RPR Ser Ql: NONREACTIVE

## 2020-02-29 LAB — ABO/RH: ABO/RH(D): B POS

## 2020-02-29 LAB — GC/CHLAMYDIA PROBE AMP (~~LOC~~) NOT AT ARMC
Chlamydia: NEGATIVE
Comment: NEGATIVE
Comment: NORMAL
Neisseria Gonorrhea: NEGATIVE

## 2020-02-29 LAB — RUBELLA SCREEN: Rubella: 2.17 index (ref >0.99)

## 2020-03-01 ENCOUNTER — Telehealth (HOSPITAL_COMMUNITY): Payer: Self-pay | Admitting: *Deleted

## 2020-03-01 LAB — CULTURE, BETA STREP (GROUP B ONLY)

## 2020-03-01 LAB — CULTURE, OB URINE: Culture: 10000 — AB

## 2020-03-01 NOTE — Telephone Encounter (Signed)
Preadmission screen  

## 2020-03-02 ENCOUNTER — Encounter (HOSPITAL_COMMUNITY): Payer: Self-pay | Admitting: *Deleted

## 2020-03-02 ENCOUNTER — Other Ambulatory Visit: Payer: Self-pay | Admitting: Student

## 2020-03-02 ENCOUNTER — Telehealth (HOSPITAL_COMMUNITY): Payer: Self-pay | Admitting: *Deleted

## 2020-03-02 MED ORDER — AMOXICILLIN 875 MG PO TABS: 875.0000 mg | ORAL_TABLET | Freq: Two times a day (BID) | ORAL | 0 refills | Status: DC

## 2020-03-02 NOTE — Telephone Encounter (Signed)
Preadmission screen  

## 2020-03-05 ENCOUNTER — Other Ambulatory Visit (HOSPITAL_COMMUNITY)
Admission: RE | Admit: 2020-03-05 | Discharge: 2020-03-05 | Disposition: A | Discharge status: Home / Self Care | Payer: Medicaid Other | Source: Ambulatory Visit | Attending: Family Medicine | Admitting: Family Medicine

## 2020-03-05 DIAGNOSIS — Z20822 Contact with and (suspected) exposure to covid-19: Secondary | ICD-10-CM | POA: Insufficient documentation

## 2020-03-05 DIAGNOSIS — Z01812 Encounter for preprocedural laboratory examination: Secondary | ICD-10-CM | POA: Insufficient documentation

## 2020-03-06 ENCOUNTER — Encounter: Payer: Self-pay | Admitting: *Deleted

## 2020-03-06 LAB — SARS CORONAVIRUS 2 (TAT 6-24 HRS): SARS Coronavirus 2: NEGATIVE

## 2020-03-07 ENCOUNTER — Ambulatory Visit (HOSPITAL_COMMUNITY): Payer: Medicaid Other | Attending: Obstetrics & Gynecology

## 2020-03-07 ENCOUNTER — Inpatient Hospital Stay (HOSPITAL_COMMUNITY)
Admission: AD | Admit: 2020-03-07 | Payer: Medicaid Other | Source: Home / Self Care | Admitting: Obstetrics & Gynecology

## 2020-03-08 ENCOUNTER — Inpatient Hospital Stay (HOSPITAL_COMMUNITY)
Admission: AD | Admit: 2020-03-08 | Discharge: 2020-03-09 | DRG: 805 | Disposition: A | Discharge status: Home / Self Care | Payer: Medicaid Other | Attending: Obstetrics & Gynecology | Admitting: Obstetrics & Gynecology

## 2020-03-08 ENCOUNTER — Other Ambulatory Visit: Payer: Self-pay

## 2020-03-08 ENCOUNTER — Encounter (HOSPITAL_COMMUNITY): Payer: Self-pay | Admitting: Obstetrics and Gynecology

## 2020-03-08 ENCOUNTER — Inpatient Hospital Stay (HOSPITAL_COMMUNITY): Payer: Medicaid Other | Admitting: Anesthesiology

## 2020-03-08 DIAGNOSIS — Z3A41 41 weeks gestation of pregnancy: Secondary | ICD-10-CM | POA: Diagnosis not present

## 2020-03-08 DIAGNOSIS — Z91040 Latex allergy status: Secondary | ICD-10-CM | POA: Diagnosis not present

## 2020-03-08 DIAGNOSIS — Z87891 Personal history of nicotine dependence: Secondary | ICD-10-CM

## 2020-03-08 DIAGNOSIS — O480 Post-term pregnancy: Secondary | ICD-10-CM | POA: Diagnosis present

## 2020-03-08 DIAGNOSIS — O4593 Premature separation of placenta, unspecified, third trimester: Secondary | ICD-10-CM

## 2020-03-08 DIAGNOSIS — O99214 Obesity complicating childbirth: Secondary | ICD-10-CM | POA: Diagnosis present

## 2020-03-08 DIAGNOSIS — O99824 Streptococcus B carrier state complicating childbirth: Secondary | ICD-10-CM | POA: Diagnosis present

## 2020-03-08 DIAGNOSIS — O0990 Supervision of high risk pregnancy, unspecified, unspecified trimester: Secondary | ICD-10-CM

## 2020-03-08 LAB — COMPREHENSIVE METABOLIC PANEL
ALT: 14 U/L (ref 0–44)
AST: 15 U/L (ref 15–41)
Albumin: 2.6 g/dL — ABNORMAL LOW (ref 3.5–5.0)
Alkaline Phosphatase: 147 U/L — ABNORMAL HIGH (ref 38–126)
Anion gap: 7 (ref 5–15)
BUN: 7 mg/dL (ref 6–20)
CO2: 22 mmol/L (ref 22–32)
Calcium: 8.7 mg/dL — ABNORMAL LOW (ref 8.9–10.3)
Chloride: 107 mmol/L (ref 98–111)
Creatinine, Ser: 0.5 mg/dL (ref 0.44–1.00)
GFR calc Af Amer: >60 mL/min (ref >60)
GFR calc non Af Amer: >60 mL/min (ref >60)
Glucose, Bld: 94 mg/dL (ref 70–99)
Potassium: 3.8 mmol/L (ref 3.5–5.1)
Sodium: 136 mmol/L (ref 135–145)
Total Bilirubin: 0.8 mg/dL (ref 0.3–1.2)
Total Protein: 5.9 g/dL — ABNORMAL LOW (ref 6.5–8.1)

## 2020-03-08 LAB — TYPE AND SCREEN
ABO/RH(D): B POS
Antibody Screen: NEGATIVE

## 2020-03-08 LAB — CBC
HCT: 32.9 % — ABNORMAL LOW (ref 36.0–46.0)
Hemoglobin: 10.7 g/dL — ABNORMAL LOW (ref 12.0–15.0)
MCH: 28.9 pg (ref 26.0–34.0)
MCHC: 32.5 g/dL (ref 30.0–36.0)
MCV: 88.9 fL (ref 80.0–100.0)
Platelets: 333 10*3/uL (ref 150–400)
RBC: 3.7 MIL/uL — ABNORMAL LOW (ref 3.87–5.11)
RDW: 13 % (ref 11.5–15.5)
WBC: 14.9 10*3/uL — ABNORMAL HIGH (ref 4.0–10.5)
nRBC: 0 % (ref 0.0–0.2)

## 2020-03-08 LAB — RPR: RPR Ser Ql: NONREACTIVE

## 2020-03-08 MED ORDER — BUPIVACAINE HCL (PF) 0.75 % IJ SOLN
INTRAMUSCULAR | Status: DC | PRN
Start: 2020-03-08 — End: 2020-03-08
  Administered 2020-03-08: 13.5 mL/h via EPIDURAL

## 2020-03-08 MED ORDER — TETANUS-DIPHTH-ACELL PERTUSSIS 5-2.5-18.5 LF-MCG/0.5 IM SUSP: 0.5000 mL | Freq: Once | INTRAMUSCULAR | Status: DC

## 2020-03-08 MED ORDER — LIDOCAINE HCL (PF) 1 % IJ SOLN: 30.0000 mL | INTRAMUSCULAR | Status: DC | PRN

## 2020-03-08 MED ORDER — OXYCODONE-ACETAMINOPHEN 5-325 MG PO TABS: 2.0000 | ORAL_TABLET | ORAL | Status: DC | PRN

## 2020-03-08 MED ORDER — OXYTOCIN-SODIUM CHLORIDE 30-0.9 UT/500ML-% IV SOLN
1.0000 m[IU]/min | INTRAVENOUS | Status: DC
  Administered 2020-03-08: 2 m[IU]/min via INTRAVENOUS

## 2020-03-08 MED ORDER — LIDOCAINE HCL (PF) 1 % IJ SOLN
INTRAMUSCULAR | Status: DC | PRN
  Administered 2020-03-08 (×2): 5 mL via EPIDURAL

## 2020-03-08 MED ORDER — SOD CITRATE-CITRIC ACID 500-334 MG/5ML PO SOLN: 30.0000 mL | ORAL | Status: DC | PRN

## 2020-03-08 MED ORDER — OXYCODONE-ACETAMINOPHEN 5-325 MG PO TABS: 1.0000 | ORAL_TABLET | ORAL | Status: DC | PRN

## 2020-03-08 MED ORDER — LACTATED RINGERS IV SOLN: 500.0000 mL | Freq: Once | INTRAVENOUS | Status: DC

## 2020-03-08 MED ORDER — FLEET ENEMA 7-19 GM/118ML RE ENEM: 1.0000 | ENEMA | RECTAL | Status: DC | PRN

## 2020-03-08 MED ORDER — LACTATED RINGERS IV SOLN: INTRAVENOUS | Status: DC

## 2020-03-08 MED ORDER — LACTATED RINGERS IV SOLN: 500.0000 mL | INTRAVENOUS | Status: DC | PRN

## 2020-03-08 MED ORDER — EPHEDRINE 5 MG/ML INJ: 10.0000 mg | INTRAVENOUS | Status: DC | PRN

## 2020-03-08 MED ORDER — ONDANSETRON HCL 4 MG/2ML IJ SOLN: 4.0000 mg | Freq: Four times a day (QID) | INTRAMUSCULAR | Status: DC | PRN

## 2020-03-08 MED ORDER — ONDANSETRON HCL 4 MG/2ML IJ SOLN: 4.0000 mg | INTRAMUSCULAR | Status: DC | PRN

## 2020-03-08 MED ORDER — MEASLES, MUMPS & RUBELLA VAC IJ SOLR: 0.5000 mL | Freq: Once | INTRAMUSCULAR | Status: DC

## 2020-03-08 MED ORDER — ACETAMINOPHEN 325 MG PO TABS: 650.0000 mg | ORAL_TABLET | ORAL | Status: DC | PRN

## 2020-03-08 MED ORDER — FENTANYL-BUPIVACAINE-NACL 0.5-0.125-0.9 MG/250ML-% EP SOLN
12.0000 mL/h | EPIDURAL | Status: DC | PRN
  Filled 2020-03-08: qty 250

## 2020-03-08 MED ORDER — LIDOCAINE-EPINEPHRINE (PF) 2 %-1:200000 IJ SOLN
INTRAMUSCULAR | Status: DC | PRN
  Administered 2020-03-08: 2 mL via EPIDURAL
  Administered 2020-03-08 (×2): 5 mL via EPIDURAL

## 2020-03-08 MED ORDER — PHENYLEPHRINE 40 MCG/ML (10ML) SYRINGE FOR IV PUSH (FOR BLOOD PRESSURE SUPPORT): 80.0000 ug | PREFILLED_SYRINGE | INTRAVENOUS | Status: DC | PRN

## 2020-03-08 MED ORDER — COCONUT OIL OIL
1.0000 "application " | TOPICAL_OIL | Status: DC | PRN
  Administered 2020-03-09: 1 via TOPICAL

## 2020-03-08 MED ORDER — BENZOCAINE-MENTHOL 20-0.5 % EX AERO: 1.0000 "application " | INHALATION_SPRAY | CUTANEOUS | Status: DC | PRN

## 2020-03-08 MED ORDER — TERBUTALINE SULFATE 1 MG/ML IJ SOLN: 0.2500 mg | Freq: Once | INTRAMUSCULAR | Status: DC | PRN

## 2020-03-08 MED ORDER — PRENATAL MULTIVITAMIN CH
1.0000 | ORAL_TABLET | Freq: Every day | ORAL | Status: DC
  Administered 2020-03-09: 1 via ORAL
  Filled 2020-03-08: qty 1

## 2020-03-08 MED ORDER — DIPHENHYDRAMINE HCL 25 MG PO CAPS: 25.0000 mg | ORAL_CAPSULE | Freq: Four times a day (QID) | ORAL | Status: DC | PRN

## 2020-03-08 MED ORDER — OXYTOCIN BOLUS FROM INFUSION
333.0000 mL | Freq: Once | INTRAVENOUS | Status: AC
  Administered 2020-03-08: 333 mL via INTRAVENOUS

## 2020-03-08 MED ORDER — MAGNESIUM HYDROXIDE 400 MG/5ML PO SUSP: 30.0000 mL | ORAL | Status: DC | PRN

## 2020-03-08 MED ORDER — WITCH HAZEL-GLYCERIN EX PADS: 1.0000 "application " | MEDICATED_PAD | CUTANEOUS | Status: DC | PRN

## 2020-03-08 MED ORDER — OXYTOCIN BOLUS FROM INFUSION: 333.0000 mL | Freq: Once | INTRAVENOUS | Status: DC

## 2020-03-08 MED ORDER — PHENYLEPHRINE 40 MCG/ML (10ML) SYRINGE FOR IV PUSH (FOR BLOOD PRESSURE SUPPORT)
80.0000 ug | PREFILLED_SYRINGE | INTRAVENOUS | Status: DC | PRN
  Filled 2020-03-08: qty 10

## 2020-03-08 MED ORDER — DIPHENHYDRAMINE HCL 50 MG/ML IJ SOLN: 12.5000 mg | INTRAMUSCULAR | Status: DC | PRN

## 2020-03-08 MED ORDER — SODIUM CHLORIDE 0.9% FLUSH: 3.0000 mL | INTRAVENOUS | Status: DC | PRN

## 2020-03-08 MED ORDER — DIBUCAINE (PERIANAL) 1 % EX OINT: 1.0000 "application " | TOPICAL_OINTMENT | CUTANEOUS | Status: DC | PRN

## 2020-03-08 MED ORDER — FENTANYL CITRATE (PF) 100 MCG/2ML IJ SOLN: 100.0000 ug | INTRAMUSCULAR | Status: DC | PRN

## 2020-03-08 MED ORDER — SODIUM CHLORIDE 0.9 % IV SOLN
2.0000 g | Freq: Once | INTRAVENOUS | Status: AC
  Administered 2020-03-08: 2 g via INTRAVENOUS
  Filled 2020-03-08: qty 2000

## 2020-03-08 MED ORDER — OXYTOCIN-SODIUM CHLORIDE 30-0.9 UT/500ML-% IV SOLN
2.5000 [IU]/h | INTRAVENOUS | Status: DC
  Filled 2020-03-08: qty 500

## 2020-03-08 MED ORDER — SODIUM CHLORIDE 0.9% FLUSH: 3.0000 mL | Freq: Two times a day (BID) | INTRAVENOUS | Status: DC

## 2020-03-08 MED ORDER — ONDANSETRON HCL 4 MG PO TABS: 4.0000 mg | ORAL_TABLET | ORAL | Status: DC | PRN

## 2020-03-08 MED ORDER — OXYTOCIN-SODIUM CHLORIDE 30-0.9 UT/500ML-% IV SOLN: 2.5000 [IU]/h | INTRAVENOUS | Status: DC

## 2020-03-08 MED ORDER — SENNOSIDES-DOCUSATE SODIUM 8.6-50 MG PO TABS
2.0000 | ORAL_TABLET | ORAL | Status: DC
  Administered 2020-03-09: 2 via ORAL
  Filled 2020-03-08: qty 2

## 2020-03-08 MED ORDER — SIMETHICONE 80 MG PO CHEW: 80.0000 mg | CHEWABLE_TABLET | ORAL | Status: DC | PRN

## 2020-03-08 MED ORDER — SODIUM CHLORIDE 0.9 % IV SOLN: 250.0000 mL | INTRAVENOUS | Status: DC | PRN

## 2020-03-08 MED ORDER — IBUPROFEN 600 MG PO TABS
600.0000 mg | ORAL_TABLET | Freq: Four times a day (QID) | ORAL | Status: DC
  Administered 2020-03-08 – 2020-03-09 (×4): 600 mg via ORAL
  Filled 2020-03-08 (×4): qty 1

## 2020-03-08 NOTE — Anesthesia Postprocedure Evaluation (Signed)
Anesthesia Post Note  Patient: Andrea Silva  Procedure(s) Performed: AN AD HOC LABOR EPIDURAL     Patient location during evaluation: Mother Baby Anesthesia Type: Epidural Level of consciousness: awake and alert Pain management: pain level controlled Vital Signs Assessment: post-procedure vital signs reviewed and stable Respiratory status: spontaneous breathing, nonlabored ventilation and respiratory function stable Cardiovascular status: stable Postop Assessment: no headache, no backache and epidural receding Anesthetic complications: no   No complications documented.  Last Vitals:  Vitals:   03/08/20 1400 03/08/20 1545  BP: 124/66 136/70  Pulse: 70 61  Resp:  18  Temp:  (!) 35.6 C  SpO2:  100%    Last Pain:  Vitals:   03/08/20 1545  TempSrc: Oral  PainSc: 0-No pain   Pain Goal: Patients Stated Pain Goal: 0 (03/08/20 1004)              Epidural/Spinal Function Patient able to flex knees: Yes (03/08/20 1545), Patient able to lift hips off bed: Yes (03/08/20 1545), Back pain beyond tenderness at insertion site: No (03/08/20 1545), Progressively worsening motor and/or sensory loss: No (03/08/20 1545)  Chemika Nightengale

## 2020-03-08 NOTE — H&P (Addendum)
OBSTETRIC ADMISSION HISTORY AND PHYSICAL  Andrea Silva is a 34 y.o. female [phone] with IUP at [redacted]w[redacted]d by [redacted]w[redacted]d U/S presenting for SOL. She reports +FMs, No LOF, no VB, no blurry vision, headaches or peripheral edema, and RUQ pain.  She plans on breast and bottle feeding. She plans on partner vasectomy for birth control. She did not receive prenatal care.   Dating: By [redacted]w[redacted]d U/S --->  Estimated Date of Delivery: 9/8/213  Sono:    OB U/S <14 weeks confirming IUP; no other U/S found    Prenatal History/Complications:  No prenatal care  Past Medical History: Past Medical History:  Diagnosis Date   Medical history non-contributory     Past Surgical History: Past Surgical History:  Procedure Laterality Date   NO PAST SURGERIES      Obstetrical History: OB History     Gravida  6   Para  5   Term  5   Preterm      AB      Living  5      SAB      TAB      Ectopic      Multiple  0   Live Births  5           Social History Social History   Socioeconomic History   Marital status: Single    Spouse name: Not on file   Number of children: Not on file   Years of education: Not on file   Highest education level: Not on file  Occupational History   Not on file  Tobacco Use   Smoking status: Former Smoker    Types: Cigarettes   Smokeless tobacco: Never Used  Building services engineer Use: Never used  Substance and Sexual Activity   Alcohol use: No   Drug use: No   Sexual activity: Not Currently    Birth control/protection: None  Other Topics Concern   Not on file  Social History Narrative   Not on file   Social Determinants of Health   Financial Resource Strain:    Difficulty of Paying Living Expenses: Not on file  Food Insecurity:    Worried About Programme researcher, broadcasting/film/video in the Last Year: Not on file   [employer] of Food in the Last Year: Not on file  Transportation Needs:    Lack of Transportation (Medical): Not on file   Lack of Transportation  (Non-Medical): Not on file  Physical Activity:    Days of Exercise per Week: Not on file   Minutes of Exercise per Session: Not on file  Stress:    Feeling of Stress : Not on file  Social Connections:    Frequency of Communication with Friends and Family: Not on file   Frequency of Social Gatherings with Friends and Family: Not on file   Attends Religious Services: Not on file   Active Member of Clubs or Organizations: Not on file   Attends Banker Meetings: Not on file   Marital Status: Not on file    Family History: Family History  Problem Relation Age of Onset   Anemia Mother    Cancer Mother    COPD Mother    Alcohol abuse Father    Hypertension Father    Hyperlipidemia Father    Thyroid disease Sister    Diabetes Maternal Grandmother    Cancer Maternal Grandmother     Allergies: Allergies  Allergen Reactions   Latex Rash  Medications Prior to Admission  Medication Sig Dispense Refill Last Dose   calcium carbonate (TUMS - DOSED IN MG ELEMENTAL CALCIUM) 500 MG chewable tablet Chew 1 tablet by mouth daily.   03/07/2020 at Unknown time   Prenatal Vit-Fe Fumarate-FA (PRENATAL MULTIVITAMIN) TABS tablet Take 1 tablet by mouth daily at 12 noon.    03/07/2020 at Unknown time   acetaminophen (TYLENOL) 325 MG tablet Take 650 mg by mouth every 6 (six) hours as needed for moderate pain.      albuterol (PROVENTIL HFA;VENTOLIN HFA) 108 (90 Base) MCG/ACT inhaler Inhale 1-2 puffs into the lungs every 6 (six) hours as needed for wheezing or shortness of breath. 1 Inhaler 0    amoxicillin (AMOXIL) 875 MG tablet Take 1 tablet (875 mg total) by mouth 2 (two) times daily. 10 tablet 0      Review of Systems   All systems reviewed and negative except as stated in HPI  Pulse 86, temperature 98.4 F (36.9 C), temperature source Oral, resp. rate 20, SpO2 100 %, unknown if currently breastfeeding. General appearance: alert, cooperative, appears older than stated age, no  distress and morbidly obese Lungs: clear to auscultation bilaterally Heart: regular rate and rhythm Abdomen: soft, non-tender; bowel sounds normal Pelvic: deferred Extremities: Trace edema b/l, no sign of DVT Presentation: cephalic Fetal monitoringBaseline: 140 bpm, Variability: Good {> 6 bpm), Accelerations: Reactive and Decelerations: Absent Uterine activityFrequency: Every 1-2 minutes Dilation: 5 Effacement (%): 80 Station: -3 Exam by:: Quintella Baton, RNC   Prenatal labs: ABO, Rh: --/--/B POS (09/07 1856) Antibody:   Rubella: 2.17 (09/07 1856) RPR: NON REACTIVE (09/07 1856)  HBsAg: NON REACTIVE (09/07 1856)  HIV: Non Reactive (09/07 1856)  GBS: Positive/-- (09/07 0000)  Hgb A1c 9/7: 5.5 Genetic screening: Not performed Anatomy US: Not performed  Prenatal Transfer Tool  Maternal Diabetes: No Genetic Screening: Declined Maternal Ultrasounds/Referrals: Declined Fetal Ultrasounds or other Referrals:  None Maternal Substance Abuse:  No Significant Maternal Medications:  None Significant Maternal Lab Results: Group B Strep positive  No results found for this or any previous visit (from the past 24 hour(s)).  Patient Active Problem List   Diagnosis Date Noted   Term pregnancy 03/08/2020   Supervision of high-risk pregnancy 03/08/2020   NSVD (normal spontaneous vaginal delivery) 03/15/2015    Assessment/Plan:  Andrea Silva is a 34 y.o. G6P5005 at [redacted]w[redacted]d here for spontaneous onset of labor  #Labor: Patient presented in spontaneous onset of labor. Cervical check recently in MAU. Expectant management for now, can consider AROM at next check #Pain: Per patient request, would like epidural  #FWB: Cat I, reactive #ID:  GBS+, Amp #MOF: Breast and bottle #MOC: Partner vasectomy #Circ:  Yes, desires for infant #No prenatal care: SW consult  #Anemia: Admission Hgb 10.7 #Elevated BP: Recent pressures 152/94 and 144/88. Patient in discomfort but given no prenatal care will  order CMP and protein creatinine ratio. Platelets WNL.  Sabino Dick, DO  03/08/2020, 6:02 AM   Attestation of Supervision of Student:  I confirm that I have verified the information documented in the  resident's  note and that I have also personally reperformed the history, physical exam and all medical decision making activities.  I have verified that all services and findings are accurately documented in this student's note; and I agree with management and plan as outlined in the documentation. I have also made any necessary editorial changes.  Sheila Oats, MD [facility], [facility] 03/08/2020 9:29  AM

## 2020-03-08 NOTE — MAU Note (Signed)
.   Andrea Silva is a 34 y.o. at [redacted]w[redacted]d here in MAU reporting: ctx q 1 minute apart. No VB or LOF  Pain score: 10 Vitals:   03/08/20 0541  Pulse: 86  Resp: 20  Temp: 98.4 F (36.9 C)  SpO2: 100%     FHT:155

## 2020-03-08 NOTE — Anesthesia Preprocedure Evaluation (Signed)
Anesthesia Evaluation  Patient identified by MRN, date of birth, ID band Patient awake    Reviewed: Allergy & Precautions, Patient's Chart, lab work & pertinent test results  Airway Mallampati: III  TM Distance: >3 FB Neck ROM: Full    Dental no notable dental hx. (+) Teeth Intact   Pulmonary asthma , former smoker,    Pulmonary exam normal breath sounds clear to auscultation       Cardiovascular Normal cardiovascular exam Rhythm:Regular Rate:Normal     Neuro/Psych negative neurological ROS  negative psych ROS   GI/Hepatic Neg liver ROS, GERD  Medicated and Controlled,  Endo/Other  Morbid obesity  Renal/GU negative Renal ROS  negative genitourinary   Musculoskeletal negative musculoskeletal ROS (+)   Abdominal (+) + obese,   Peds  Hematology  (+) anemia ,   Anesthesia Other Findings   Reproductive/Obstetrics (+) Pregnancy Grand multipara                             Anesthesia Physical Anesthesia Plan  ASA: III  Anesthesia Plan: Epidural   Post-op Pain Management:    Induction:   PONV Risk Score and Plan:   Airway Management Planned: Natural Airway  Additional Equipment:   Intra-op Plan:   Post-operative Plan:   Informed Consent: I have reviewed the patients History and Physical, chart, labs and discussed the procedure including the risks, benefits and alternatives for the proposed anesthesia with the patient or authorized representative who has indicated his/her understanding and acceptance.       Plan Discussed with: Anesthesiologist  Anesthesia Plan Comments:         Anesthesia Quick Evaluation

## 2020-03-08 NOTE — Anesthesia Procedure Notes (Signed)
Epidural Patient location during procedure: OB Start time: 03/08/2020 6:58 AM End time: 03/08/2020 7:08 AM  Staffing Anesthesiologist: Mal Amabile, MD Performed: anesthesiologist   Preanesthetic Checklist Completed: patient identified, IV checked, site marked, risks and benefits discussed, surgical consent, monitors and equipment checked, pre-op evaluation and timeout performed  Epidural Patient position: sitting Prep: DuraPrep and site prepped and draped Patient monitoring: continuous pulse ox and blood pressure Approach: midline Location: L4-L5 Injection technique: LOR air  Needle:  Needle type: Tuohy  Needle gauge: 17 G Needle length: 9 cm and 9 Needle insertion depth: 8 and 8 cm Catheter type: closed end flexible Catheter size: 19 Gauge Catheter at skin depth: 10 cm Test dose: negative and Other  Assessment Events: blood not aspirated, injection not painful, no injection resistance, no paresthesia and negative IV test  Additional Notes Patient identified. Risks and benefits discussed including failed block, incomplete  Pain control, post dural puncture headache, nerve damage, paralysis, blood pressure Changes, nausea, vomiting, reactions to medications-both toxic and allergic and post Partum back pain. All questions were answered. Patient expressed understanding and wished to proceed. Sterile technique was used throughout procedure. Epidural site was Dressed with sterile barrier dressing. No paresthesias, signs of intravascular injection Or signs of intrathecal spread were encountered.  Patient was more comfortable after the epidural was dosed. Please see RN's note for documentation of vital signs and FHR which are stable. Reason for block:procedure for pain

## 2020-03-08 NOTE — Progress Notes (Signed)
LABOR PROGRESS NOTE  Andrea Silva is a 34 y.o. [id number] at [redacted]w[redacted]d  admitted for SOL.   Subjective: Feeling contractions.   Objective: BP 124/60   Pulse (!) 113   Temp 98.7 F (37.1 C) (Oral)   Resp 16   Ht 5\' 11"  (1.803 m)   Wt (!) 140.6 kg   SpO2 100%   BMI 43.24 kg/m  or  Vitals:   03/08/20 1015 03/08/20 1023 03/08/20 1027 03/08/20 1030  BP: 129/61 (!) 117/51 (!) 106/53 124/60  Pulse: 77 91 80 (!) 113  Resp: 16     Temp:      TempSrc:      SpO2:      Weight:      Height:        Dilation: 7.5 Effacement (%): 100 Cervical Position: Middle Station: -1, 0 Presentation: Vertex Exam by:: S Nix, RN FHT: baseline rate 140, moderate varibility, +acel, -decel Toco: every 2-4 min  Labs: Lab Results  Component Value Date   WBC 14.9 (H) 03/08/2020   HGB 10.7 (L) 03/08/2020   HCT 32.9 (L) 03/08/2020   MCV 88.9 03/08/2020   PLT 333 03/08/2020    Patient Active Problem List   Diagnosis Date Noted  . Post term pregnancy at [redacted] weeks gestation 03/08/2020  . Supervision of high-risk pregnancy 03/08/2020  . NSVD (normal spontaneous vaginal delivery) 03/15/2015    Assessment / Plan: 34 y.o. G6P5005 at [redacted]w[redacted]d here for SOL.   Labor: SROM earlier this am, progressing well with expectant management since that time.  Fetal Wellbeing:  Cat 2 with occasional variables, placed on to her side with some improvement, will recheck in 10 minutes and likely place IUPC +/- amnioinfusion pending exam.  Pain Control:  Epidural placed  Anticipated MOD:  SVD  #No PNC: Will need SW consult pp.   [redacted]w[redacted]d, DO Family Medicine PGY-3  03/08/2020, 10:41 AM

## 2020-03-08 NOTE — Progress Notes (Signed)
Andrea Silva is a 34 y.o. [id number] at [redacted]w[redacted]d by LMP (no PNC) admitted for active labor.  Subjective: Pt resting on left side, feeling more pressure with contractions. Discussed what led to her having no PNC, she was having trouble getting seen and was told to call MFM who "couldn't get me in until 10/4."   Objective: BP 124/60   Pulse (!) 113   Temp 98.7 F (37.1 C) (Oral)   Resp 16   Ht 5\' 11"  (1.803 m)   Wt (!) 310 lb (140.6 kg)   SpO2 100%   BMI 43.24 kg/m  No intake/output data recorded. No intake/output data recorded.  FHT:  FHR: 135 bpm, variability: moderate,  accelerations:  Present,  decelerations:  Absent UC:   regular, every 3-5 minutes SVE:   Dilation: 7.5 Effacement (%): 100 Station: -1, 0 Exam by:: S Nix, RN  Labs: Lab Results  Component Value Date   WBC 14.9 (H) 03/08/2020   HGB 10.7 (L) 03/08/2020   HCT 32.9 (L) 03/08/2020   MCV 88.9 03/08/2020   PLT 333 03/08/2020    Assessment / Plan: Spontaneous labor, progressing normally  Labor: Progressing normally Preeclampsia:  no signs or symptoms of toxicity Fetal Wellbeing:  Category I Pain Control:  Epidural I/D:  n/a Anticipated MOD:  NSVD  03/10/2020, CNM, MSN, [facility] 03/08/20 11:09 AM

## 2020-03-08 NOTE — Progress Notes (Signed)
Dr Melba Coon notified of pt's admission and status. WIll admit to [facility]

## 2020-03-08 NOTE — Discharge Summary (Addendum)
Postpartum Discharge Summary  03/09/20     Patient Name: Andrea Silva DOB: 09/06/1985 MRN: [id number]  Date of admission: 03/08/2020 Delivery date:03/08/2020  Delivering provider: Patriciaann Clan  Date of discharge: 03/09/2020  Admitting diagnosis: Term pregnancy [Z34.90] Supervision of high-risk pregnancy [O09.90] Intrauterine pregnancy: [redacted]w[redacted]d    Secondary diagnosis:  Active Problems:   Post term pregnancy at 427weeks gestation   Supervision of high-risk pregnancy  Additional problems: No prenatal care.     Discharge diagnosis: Term Pregnancy Delivered                                              Post partum procedures: none Augmentation: N/A Complications: Placental Abruption  Hospital course: Onset of Labor With Vaginal Delivery      34y.o. yo [id number] 32w1das admitted in Active Labor on 03/08/2020. Patient had an uncomplicated labor course as follows:  Membrane Rupture Time/Date: 7:40 AM ,03/08/2020   Delivery Method:Vaginal, Spontaneous  Episiotomy: None  Lacerations:  None  Patient had an uncomplicated postpartum course.  She is ambulating, tolerating a regular diet, passing flatus, and urinating well. Patient is discharged home in stable condition on 03/09/20.  Newborn Data: Birth date:03/08/2020  Birth time:1:01 PM  Gender:Female  Living status:Living  Apgars:8 ,9  Weight:9 lb 11.9 oz (4.42 kg)   Magnesium Sulfate received: No BMZ received: No Rhophylac:No MMR: Prior to d/c T-DaP: Prior to d/c Flu: No Transfusion:No  Physical exam  Vitals:   03/08/20 1545 03/08/20 1945 03/09/20 0000 03/09/20 0523  BP: 136/70 126/74 124/63 129/73  Pulse: 61 64 63 62  Resp: '18 18 17 18  ' Temp: (!) 96.1 F (35.6 C) 99.2 F (37.3 C) 98.8 F (37.1 C) 98.5 F (36.9 C)  TempSrc: Oral Oral Oral   SpO2: 100% 99% 98% 98%  Weight:      Height:       General: alert, cooperative and no distress Lochia: appropriate Uterine Fundus: firm Incision: N/A DVT  Evaluation: No evidence of DVT seen on physical exam. No cords or calf tenderness. No significant calf/ankle edema. Labs: Lab Results  Component Value Date   WBC 14.9 (H) 03/08/2020   HGB 10.7 (L) 03/08/2020   HCT 32.9 (L) 03/08/2020   MCV 88.9 03/08/2020   PLT 333 03/08/2020   CMP Latest Ref Rng & Units 03/08/2020  Glucose 70 - 99 mg/dL 94  BUN 6 - 20 mg/dL 7  Creatinine 0.44 - 1.00 mg/dL 0.50  Sodium 135 - 145 mmol/L 136  Potassium 3.5 - 5.1 mmol/L 3.8  Chloride 98 - 111 mmol/L 107  CO2 22 - 32 mmol/L 22  Calcium 8.9 - 10.3 mg/dL 8.7(L)  Total Protein 6.5 - 8.1 g/dL 5.9(L)  Total Bilirubin 0.3 - 1.2 mg/dL 0.8  Alkaline Phos 38 - 126 U/L 147(H)  AST 15 - 41 U/L 15  ALT 0 - 44 U/L 14   Edinburgh Score: Edinburgh Postnatal Depression Scale Screening Tool 03/09/2020  I have been able to laugh and see the funny side of things. 0  I have looked forward with enjoyment to things. 0  I have blamed myself unnecessarily when things went wrong. 1  I have been anxious or worried for no good reason. 2  I have felt scared or panicky for no good reason. 0  Things have been getting on top  of me. 1  I have been so unhappy that I have had difficulty sleeping. 0  I have felt sad or miserable. 0  I have been so unhappy that I have been crying. 0  The thought of harming myself has occurred to me. 0  Edinburgh Postnatal Depression Scale Total 4     After visit meds:  Allergies as of 03/09/2020       Reactions   Latex Rash        Medication List     STOP taking these medications    amoxicillin 875 MG tablet Commonly known as: AMOXIL   calcium carbonate 500 MG chewable tablet Commonly known as: TUMS - dosed in mg elemental calcium       TAKE these medications    acetaminophen 325 MG tablet Commonly known as: TYLENOL Take 650 mg by mouth every 6 (six) hours as needed for moderate pain.   albuterol 108 (90 Base) MCG/ACT inhaler Commonly known as: VENTOLIN HFA Inhale 1-2  puffs into the lungs every 6 (six) hours as needed for wheezing or shortness of breath.   coconut oil Oil Apply 1 application topically as needed (nipple pain).   ibuprofen 600 MG tablet Commonly known as: ADVIL Take 1 tablet (600 mg total) by mouth every 8 (eight) hours as needed for moderate pain or cramping.   norethindrone 0.35 MG tablet Commonly known as: Ortho Micronor Take 1 tablet (0.35 mg total) by mouth daily.   prenatal multivitamin Tabs tablet Take 1 tablet by mouth daily at 12 noon.         Discharge home in stable condition Infant Feeding: Breast Infant Disposition:home with mother Discharge instruction: per After Visit Summary and Postpartum booklet. Activity: Advance as tolerated. Pelvic rest for 6 weeks.  Diet: routine diet Future Appointments:No future appointments. Follow up Visit: Sent message to [facility] to schedule PP visit given no prenatal care.  Please schedule this patient for a In person postpartum visit in 4 weeks with the following provider: Any provider. Additional Postpartum F/U: SW f/u at postpartum appt given no prenatal care   Low risk pregnancy complicated by: no prenatal care Delivery mode:  Vaginal, Spontaneous  Anticipated Birth Control:   plan for partner vasectomy  (script for POPs on discharge)   03/09/2020 Randa Ngo, MD

## 2020-03-09 ENCOUNTER — Telehealth: Payer: Self-pay | Admitting: General Practice

## 2020-03-09 DIAGNOSIS — Z3A41 41 weeks gestation of pregnancy: Secondary | ICD-10-CM

## 2020-03-09 DIAGNOSIS — O99824 Streptococcus B carrier state complicating childbirth: Secondary | ICD-10-CM

## 2020-03-09 DIAGNOSIS — O480 Post-term pregnancy: Secondary | ICD-10-CM

## 2020-03-09 DIAGNOSIS — O4593 Premature separation of placenta, unspecified, third trimester: Secondary | ICD-10-CM

## 2020-03-09 MED ORDER — NORETHINDRONE 0.35 MG PO TABS: 1.0000 | ORAL_TABLET | Freq: Every day | ORAL | 10 refills | Status: DC

## 2020-03-09 MED ORDER — COCONUT OIL OIL: 1.0000 "application " | TOPICAL_OIL | 0 refills | Status: DC | PRN

## 2020-03-09 MED ORDER — IBUPROFEN 600 MG PO TABS: 600.0000 mg | ORAL_TABLET | Freq: Three times a day (TID) | ORAL | 0 refills | Status: DC | PRN

## 2020-03-09 NOTE — Lactation Note (Signed)
This note was copied from a baby's chart. Lactation Consultation Note  Patient Name: Andrea Silva [id number] Date: 03/09/2020 Reason for consult: Initial assessment;Term  Visited with mom of a 58 hours old FT female, she's a P6 with no prenatal care, UDS (+) for THC. baby is 9-11 lbs but he's been normoglycemic. Mom only BF her last two children, the 4th baby for 1 month and the 5th baby for 6 months. Her main BF difficulty was low supply, noticed that she had hirsutism on her face. Mom wasn't familiar with hand expression though, LC revised hand expression with mom and she was able to do teach back and get some drops of colostrum, praised her for her efforts.   Offered assistance with latch but mom politely declined, she said baby wasn't due for a feeding yet. Asked mom to call for assistance when needed, no LATCH score documented in chart at this point. Mom and baby are going home today, baby is at 1% weight loss, she's already supplementing with Similac 20 calorie formula per feeding choice on admission.  Reviewed normal newborn behavior, feeding cues, discharge instructions, engorgement prevention/treatment, treatment/prevention for sore nipples, supply/demand and lactogenesis II. However, due to low supply issues she's never got engorged before, encouraged her to pump after feedings to boost her supply, especially when baby is being given formula.  Feeding plan:  1. Encouraged mom to feed baby STS 8-12 times/24 hours or sooner if feeding cues are present 2. Hand expression and finger feeding were also encouraged 3. Mom will continue supplementing baby with Similac 20 calorie formula per feeding choice on admission  BF brochure, BF resources and feeding diary were reviewed. No support person in mom's room at the time of [facility] consultation. Mom reported all questions and concerns were answered, she's aware of LC OP services and will call PRN.   Maternal Data Formula Feeding for Exclusion:  Yes Reason for exclusion: Mother's choice to formula and breast feed on admission Has patient been taught Hand Expression?: Yes Does the patient have breastfeeding experience prior to this delivery?: Yes  Feeding Feeding Type: Breast Fed  LATCH Score                   Interventions Interventions: Breast feeding basics reviewed;Hand pump;Breast massage;Hand express;Breast compression  Lactation Tools Discussed/Used Tools: Pump Breast pump type: Manual WIC Program: No   Consult Status Consult Status: Complete    Andrea Silva 03/09/2020, 12:00 PM

## 2020-03-09 NOTE — Clinical Social Work Maternal (Signed)
CLINICAL SOCIAL WORK MATERNAL/CHILD NOTE  Patient Details  Name: Andrea Silva MRN: [id number] Date of Birth: [birth year]  Date:  03/09/2020  Clinical Social Worker Initiating Note:  Abundio Miu, [place] Date/Time: Initiated:  03/09/20/1220     Child's Name:  Andrea Silva   Biological Parents:  Mother, Father (Father: Wilhemina Cash)   Need for Interpreter:  None   Reason for Referral:  Current Substance Use/Substance Use During Pregnancy , Late or No Prenatal Care    Address:  [address] [place] [address]    Phone number:  [phone] (home)     Additional phone number:   Household Members/Support Persons (HM/SP):   Household Member/Support Person 1, Household Member/Support Person 2, Household Member/Support Person 3, Household Member/Support Person 4, Household Member/Support Person 5   HM/SP Name Relationship DOB or Age  HM/SP -1 Wilhemina Cash FOB 09/13/1982  HM/SP -2 Messiah Minter son 02/11/12  HM/SP -3 Alaya Minter daughter 04/13/14  HM/SP -4 Anders Simmonds daughter 03/15/15  HM/SP -5 [name] Minter son 02/26/16  HM/SP -6        HM/SP -7        HM/SP -8          Natural Supports (not living in the home):  Parent   Professional Supports: None   Employment: Unemployed   Type of Work:     Education:  [place] arranged:    Museum/gallery curator Resources:  Medicaid   Other Resources:      Cultural/Religious Considerations Which May Impact Care:    Strengths:  Ability to meet basic needs , Engineer, materials, Home prepared for child    Psychotropic Medications:         Pediatrician:    [place] area  Pediatrician List:   [facility] ([address])  [place]      Pediatrician Fax Number:    Risk Factors/Current Problems:  Substance Use    Cognitive State:  Able to Concentrate , Alert , Goal Oriented  , Linear Thinking    Mood/Affect:  Calm , Interested , Comfortable    CSW Assessment: CSW met with MOB at bedside to discuss consult for no prenatal care. CSW introduced self and explained reason for consult. MOB was welcoming, pleasant and remained engaged during assessment. MOB reported that she is unemployed and resides with FOB and older children. MOB reported that her child Andrea Silva 09/23/06) resides with her sister Rasha Ibe) in [place] and reported that she doesn't have phone number just speaks with them over facebook messenger. MOB reported that CPS wasn't really involved in son being placed with her sister. MOB reported that she had an open case with [place] 3 years ago that is now closed. MOB reported that she has all items needed to care for infant including car seat and basinet. CSW inquired about MOB's support system, MOB reported that FOB and her mother are supports.   CSW inquired about MOB's mental health history, MOB denied any mental health history. MOB denied any postpartum depression history. CSW inquired about how MOB was feeling emotionally after giving birth, MOB reported that she was feeling relieved and shared that she had a rough pregnancy. MOB presented calm and did not demonstrate any acute mental health signs/symptoms. CSW assessed for safety, MOB denied SI, HI and domestic violence.  CSW provided education regarding the baby blues period vs. perinatal mood disorders, discussed treatment and gave resources for mental health follow up if concerns arise.  CSW recommends self-evaluation during the postpartum time period using the New Mom Checklist from Postpartum Progress and encouraged MOB to contact a medical professional if symptoms are noted at any time.    CSW provided review of Sudden Infant Death Syndrome (SIDS) precautions.    CSW informed MOB about the hospital drug screen policy due to no prenatal care. MOB confirmed having no prenatal  care and reported her barriers to prenatal care were finding out about pregnancy at 13 weeks and not being able to find a provider. CSW inquired about any barriers with getting infant to the pediatrician, MOB reported none. CSW inquired about any substance use during pregnancy, MOB reported none. CSW informed MOB that infant's UDS was positive for THC, MOB reported that she used marijuana before she found out she was pregnant. MOB denied any other substance use during pregnancy. CSW informed MOB that a CPS report would be made due to infant's positive UDS for THC, MOB verbalized understanding and denied any questions/concerns.   CSW will make CPS report for infant's positive UDS for THC.   CSW identifies no further need for intervention and no barriers to discharge at this time.   CSW Plan/Description:  Sudden Infant Death Syndrome (SIDS) Education, Perinatal Mood and Anxiety Disorder (PMADs) Education, [place], Child Protective Service Report , No Further Intervention Required/No Barriers to Discharge    Burnis Medin, LCSW 03/09/2020, 12:41 PM

## 2020-03-09 NOTE — Progress Notes (Signed)
CSW made [facility] CPS report for infant's positive UDS for THC. CPS to follow up with family within 72 hours. No barriers to discharge at this time.  Celso Sickle, LCSW Clinical Social Worker [facility] Cell#: [phone]

## 2020-03-09 NOTE — Telephone Encounter (Signed)
-----   Message from York Ram sent at 03/09/2020 10:05 AM EDT ----- Regarding: FW: PP Visit Can you schedule this patient please ----- Message ----- From: Sheila Oats, MD Sent: 03/09/2020   5:35 AM EDT To: [facility] Admin Pool Subject: PP Visit                                       ##This pt did not receive any prenatal care, except for several visits in MAU.  Please schedule this patient for a In person postpartum visit in 4 weeks with the following provider: Any provider. Additional Postpartum F/U:SW f/u at postpartum appt given no prenatal care  Low risk pregnancy complicated by: no prenatal care Delivery mode:  Vaginal, Spontaneous  Anticipated Birth Control:  plan for partner vasectomy

## 2020-03-09 NOTE — Telephone Encounter (Signed)
Patient aware of Postpartum appt scheduled on 04/04/2020 at 10:10am and has voiced understanding.

## 2020-04-04 ENCOUNTER — Ambulatory Visit: Payer: Medicaid Other | Admitting: Certified Nurse Midwife

## 2020-04-06 ENCOUNTER — Ambulatory Visit (INDEPENDENT_AMBULATORY_CARE_PROVIDER_SITE_OTHER): Payer: Medicaid Other

## 2020-04-06 ENCOUNTER — Other Ambulatory Visit: Payer: Self-pay

## 2020-04-06 NOTE — Progress Notes (Signed)
Post Partum Visit Note  Andrea Silva is a 34 y.o. [phone] female who presents for a postpartum visit. She is 4 weeks postpartum following a normal spontaneous vaginal delivery.  I have fully reviewed the prenatal and intrapartum course. The delivery was at 41 gestational weeks.  Anesthesia: epidural. Postpartum course has been uncomplicated. Baby is doing well. Baby is feeding by both breast and bottle - Similac Advance and Similac ProAdvance. Bleeding staining only. Bowel function is normal. Bladder function is normal. Patient is not sexually active. Contraception method is OCP (estrogen/progesterone). Postpartum depression screening: negative.  States breastfeeding is "going... I wish it was going a little better."  Patient reports she receives help from FOB (occasionally) and her mother.   The pregnancy intention screening data noted above was reviewed. Potential methods of contraception were discussed. The patient elected to proceed with Oral Contraceptive.    Edinburgh Postnatal Depression Scale - 04/06/20 1100      Edinburgh Postnatal Depression Scale:  In the Past 7 Days   I have been able to laugh and see the funny side of things. 0    I have looked forward with enjoyment to things. 0    I have blamed myself unnecessarily when things went wrong. 1    I have been anxious or worried for no good reason. 0    I have felt scared or panicky for no good reason. 0    Things have been getting on top of me. 0    I have been so unhappy that I have had difficulty sleeping. 0    I have felt sad or miserable. 0    I have been so unhappy that I have been crying. 0    The thought of harming myself has occurred to me. 0    Edinburgh Postnatal Depression Scale Total 1            The following portions of the patient's history were reviewed and updated as appropriate: allergies, current medications, past family history, past medical history, past social history, past surgical history and  problem list.  Review of Systems Pertinent items are noted in HPI.    Objective:  Blood pressure 125/89, pulse 67, temperature 98.4 F (36.9 C), temperature source Oral, weight 288 lb 12.8 oz (131 kg), currently breastfeeding.  General:  alert, cooperative and no distress   Breasts:  inspection negative, no nipple discharge or bleeding, no masses or nodularity palpable  Lungs: clear to auscultation bilaterally  Heart:  regular rate and rhythm  Abdomen: soft, non-tender; bowel sounds normal; no masses,  no organomegaly   Vulva:  not evaluated  Vagina: not evaluated  Cervix:  Not Evaluated  Corpus: not examined  Adnexa:  not evaluated  Rectal Exam: Not performed.        Assessment:   34 year old 4 postpartum exam Normal Involution Breastfeeding/Bottlefeeding Pap smear not done at today's visit.   Plan:   Essential components of care per ACOG recommendations:  1.  Mood and well being: Patient with negative depression screening today. Reviewed local resources for support.  - Patient does not use tobacco.  - hx of drug use? 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   2. Infant care and feeding:  -Patient currently breastmilk feeding? Yes Reviewed importance of draining breast regularly to support lactation. -Social determinants of health (SDOH) reviewed in EPIC.   3. Sexuality, contraception and birth spacing - Patient does not want a pregnancy in the  next year.  Desired family size is 6 children. Patient states "I don't want anymore children."  - Reviewed forms of contraception in tiered fashion. Patient desired oral progesterone-only contraceptive today.  Happy with method and husband is considering a vasectomy.  - Discussed birth spacing of 18 months.  4. Sleep and fatigue -Encouraged family/partner/community support of 4 hrs of uninterrupted sleep to help with mood and fatigue  5. Physical Recovery  - Discussed patients delivery and complications. Patient states "it was  okay... it was fast."  - Patient had a no laceration. - Patient has urinary incontinence? No - Patient is safe to resume physical and sexual activity  6.  Health Maintenance - Last pap smear was unknown.  Discussed following up in 3-6 months for well woman exam.  No Mammogram  7. No Chronic Disease - PCP follow up  Cherre Robins, CNM Center for [employer], [facility]

## 2020-10-03 IMAGING — US US OB COMP LESS 14 WK
1 series · 15 of 28 positions shown · non-contrast
Comparison: None.

CLINICAL DATA: Diffuse pelvic pain

EXAM:
OBSTETRIC <14 WK ULTRASOUND
TECHNIQUE: Transabdominal ultrasound was performed for evaluation of the
gestation as well as the maternal uterus and adnexal regions.

[Series 1: us ob comp less 14 wk · 38 acquisitions, 15 frames shown]
[im 1/38]
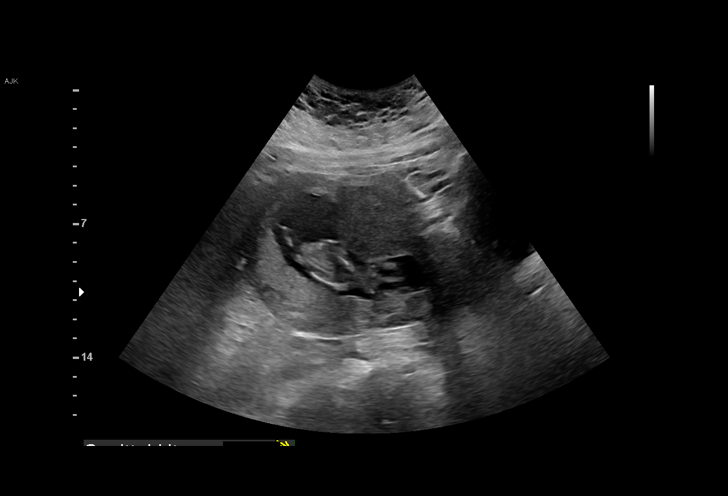
[im 3/38]
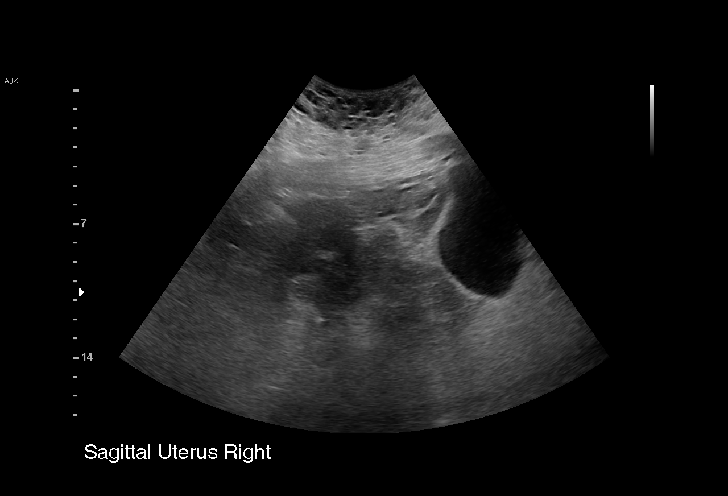
[im 6/38]
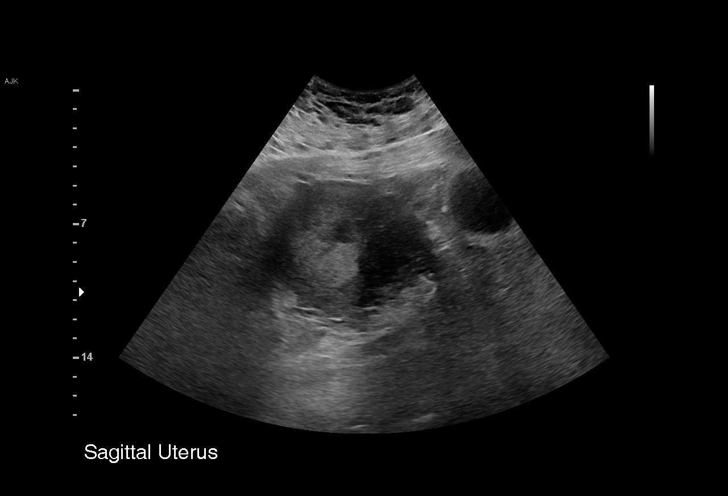
[im 9/38]
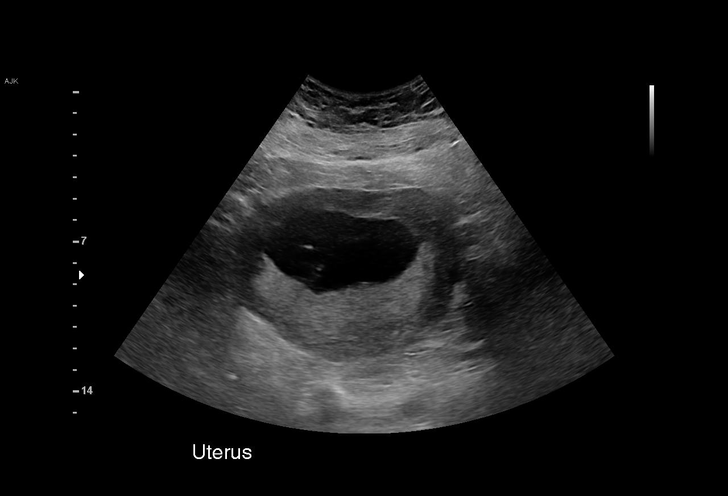
[im 11/38]
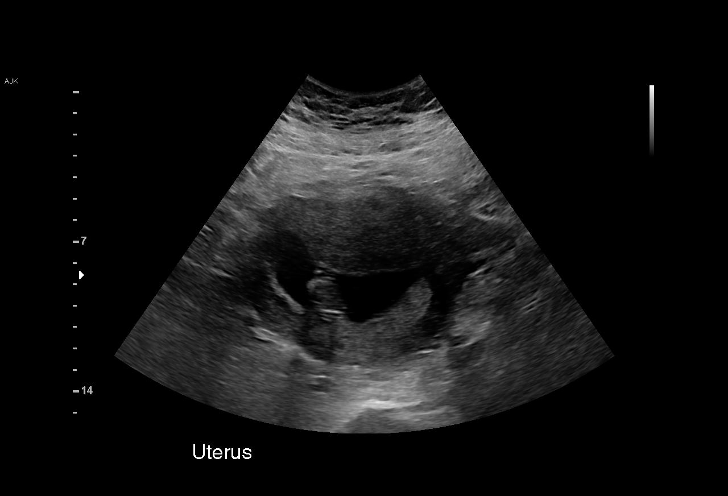
[im 14/38]
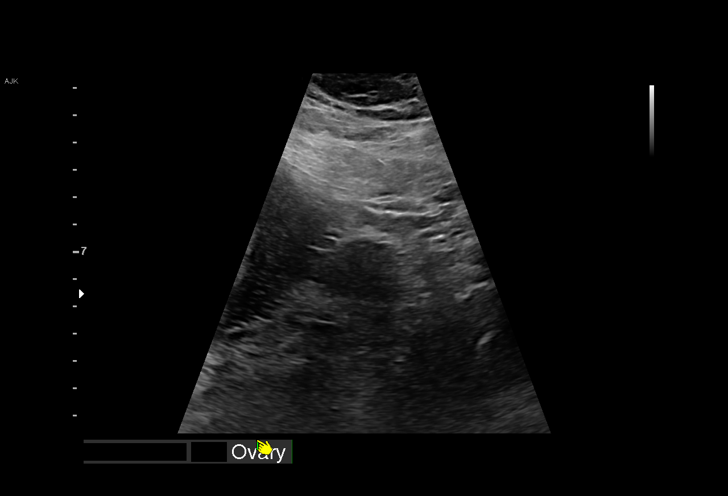
[im 17/38]
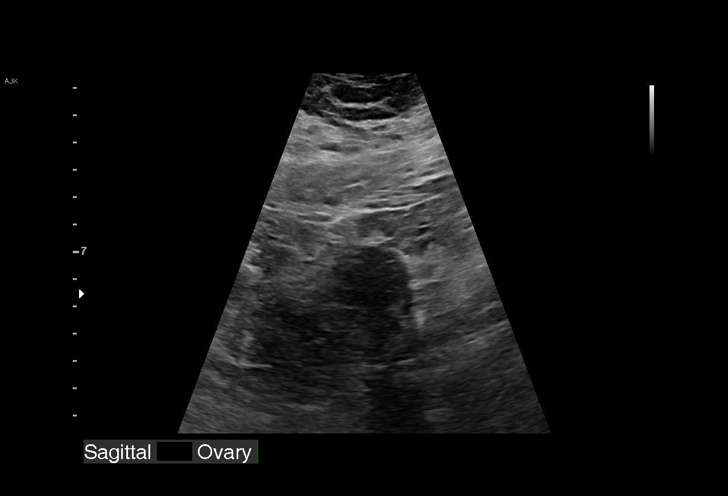
[im 20/38]
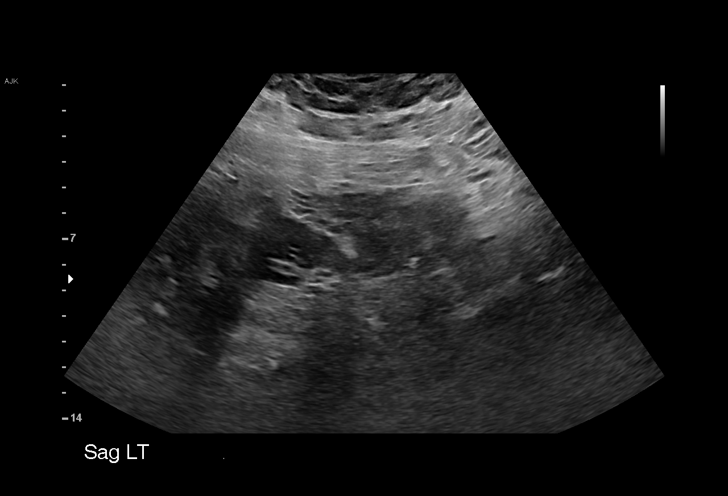
[im 21/38]
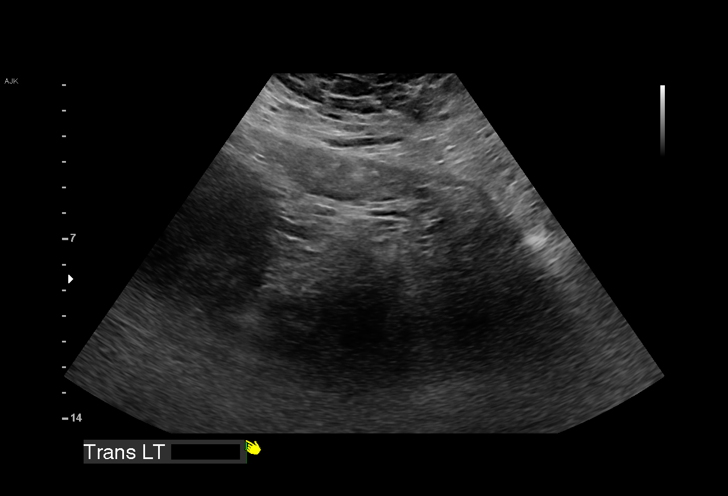
[im 24/38]
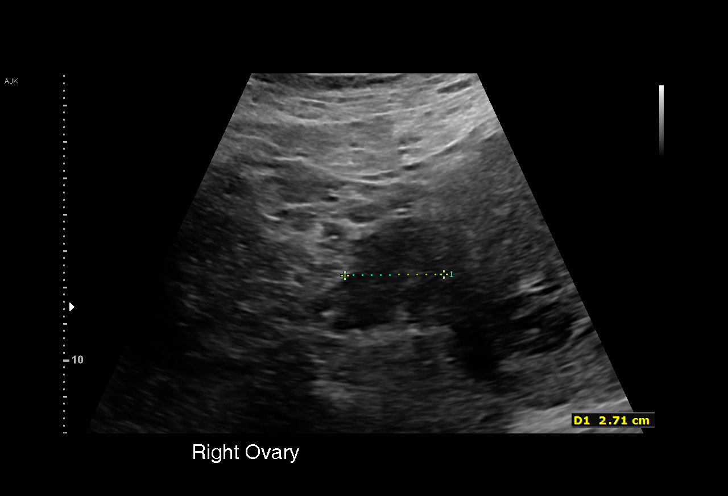
[im 27/38]
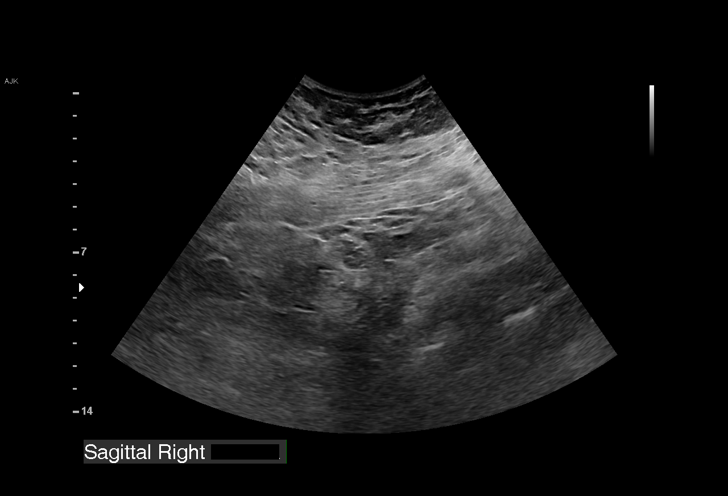
[im 29/38]
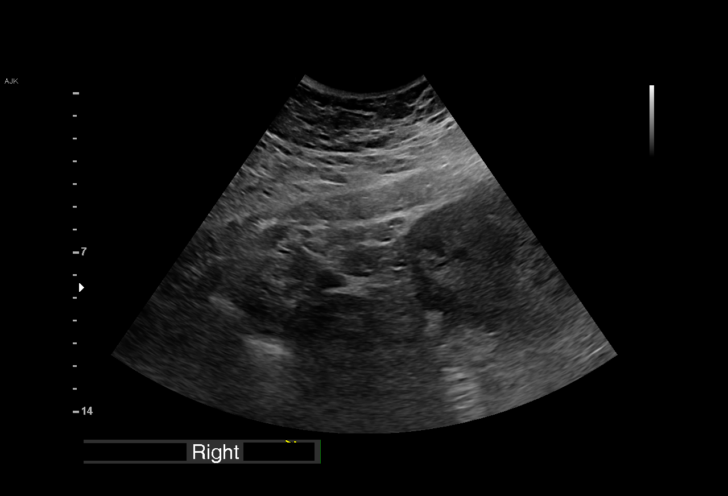
[im 32/38]
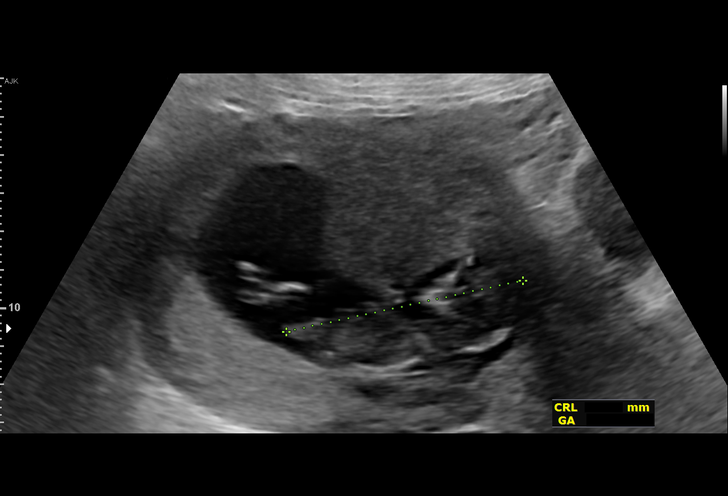
[im 35/38]
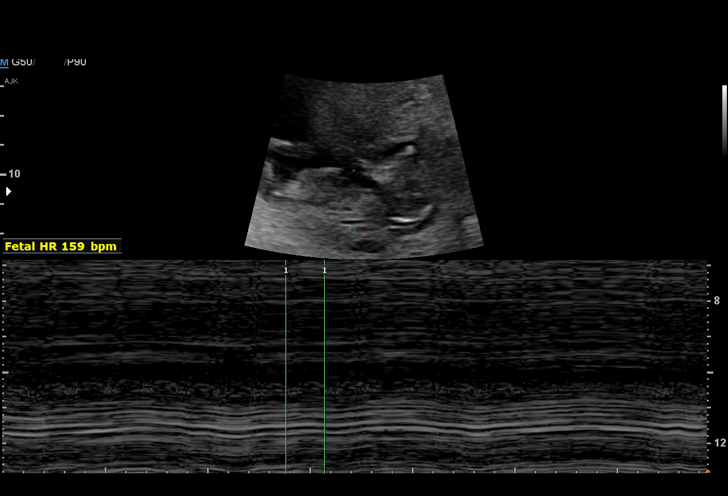
[im 38/38]
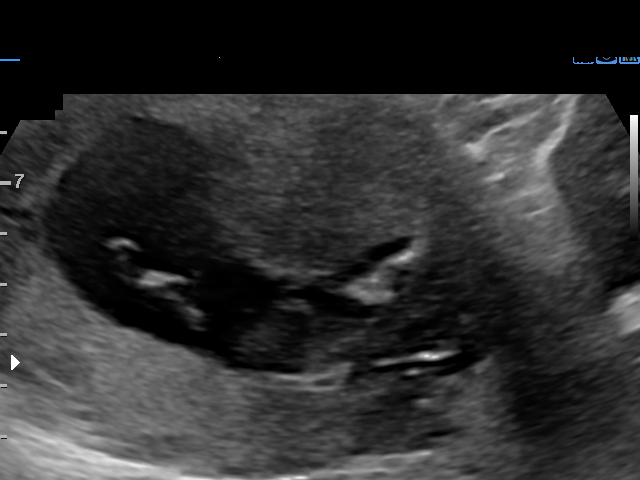

[15 of 28 positions shown; findings below may reference images not displayed]

FINDINGS: Intrauterine gestational sac: Single

Yolk sac:  Not Visualized.

Embryo:  Visualized.

Cardiac Activity: Visualized.

Heart Rate: 159 bpm

CRL: 65.1 mm   12 w 6 d                  US EDC: 02/29/2020

Subchorionic hemorrhage:  None visualized.

Maternal uterus/adnexae: Ovaries are within normal limits. Right
ovary measures 3.8 x 2.7 x 2.7 cm. The left ovary measures 4.4 x
x 3.2 cm. No significant free fluid.
IMPRESSION: Single viable intrauterine pregnancy as above. No specific
abnormality is seen.

## 2021-01-07 ENCOUNTER — Other Ambulatory Visit: Payer: Self-pay

## 2021-01-07 ENCOUNTER — Other Ambulatory Visit: Payer: Self-pay | Admitting: Obstetrics and Gynecology

## 2021-01-07 ENCOUNTER — Ambulatory Visit
Admission: RE | Admit: 2021-01-07 | Discharge: 2021-01-07 | Disposition: A | Discharge status: Home / Self Care | Payer: No Typology Code available for payment source | Source: Ambulatory Visit | Attending: Obstetrics and Gynecology | Admitting: Obstetrics and Gynecology

## 2021-01-07 DIAGNOSIS — R7612 Nonspecific reaction to cell mediated immunity measurement of gamma interferon antigen response without active tuberculosis: Secondary | ICD-10-CM

## 2021-06-23 NOTE — L&D Delivery Note (Addendum)
OB/GYN Faculty Practice Delivery Note  Andrea Silva is a 36 y.o. [id number] s/p SVD at [redacted]w[redacted]d. She was admitted for IOL s/t gHTN.   AROM: 0h 35m with clear fluid GBS Status:  Negative/-- (10/18 1656) Maximum Maternal Temperature:  Temp (24hrs), Avg:98.9 F (37.2 C), Min:98.4 F (36.9 C), Max:99.6 F (37.6 C)    Labor Progress: Patient arrived at 2cm cm dilation and was induced with cytotec 50/25, pitocin.   Delivery Date/Time: 04/11/2022 at 2153 Delivery: Called to room and patient was complete and pushing. Patient was in hands and knees position. Head delivered in direct OA position. No nuchal cord present. Shoulder and body delivered in usual fashion. Infant with spontaneous cry, placed on mother's abdomen, dried and stimulated. Cord clamped x 2 after 1-minute delay, and cut by physician. Cord blood drawn. Placenta delivered spontaneously with gentle cord traction, suprapubic pressure, and fundal massage. Pitocin was paused for delivered of the placenta. Fundus firm with massage and Pitocin. Pitocin and TXA were started after delivery of placenta, as patient delivered many clots. Fundus continued to be firm. Labia, perineum, vagina, and cervix inspected with no lacerations found.   Placenta: Delivered intact @ 0630 Complications: None Lacerations: None EBL: 703 cc Analgesia: None    Infant: APGAR (1 MIN): 9   APGAR (5 MINS): 9   APGAR (10 MINS):    Weight: pending   Lowry Ram, MD  PGY-1, [facility]  04/11/2022 10:39 PM  GME ATTESTATION:  I saw and evaluated the patient. I was gloved and available to assist if necessary for the delivery. I agree with the findings and the plan of care as documented in the resident's note. I have made changes to documentation as necessary.  Gerlene Fee, DO OB Fellow, [place] for [place] 04/12/2022, 1:01 AM

## 2022-02-18 IMAGING — CR DG CHEST 1V
1 series · 1 of 1 positions shown · non-contrast
Comparison: 09/22/2017

CLINICAL DATA: Positive TB blood test

EXAM:
CHEST  1 VIEW

[w chest pa]
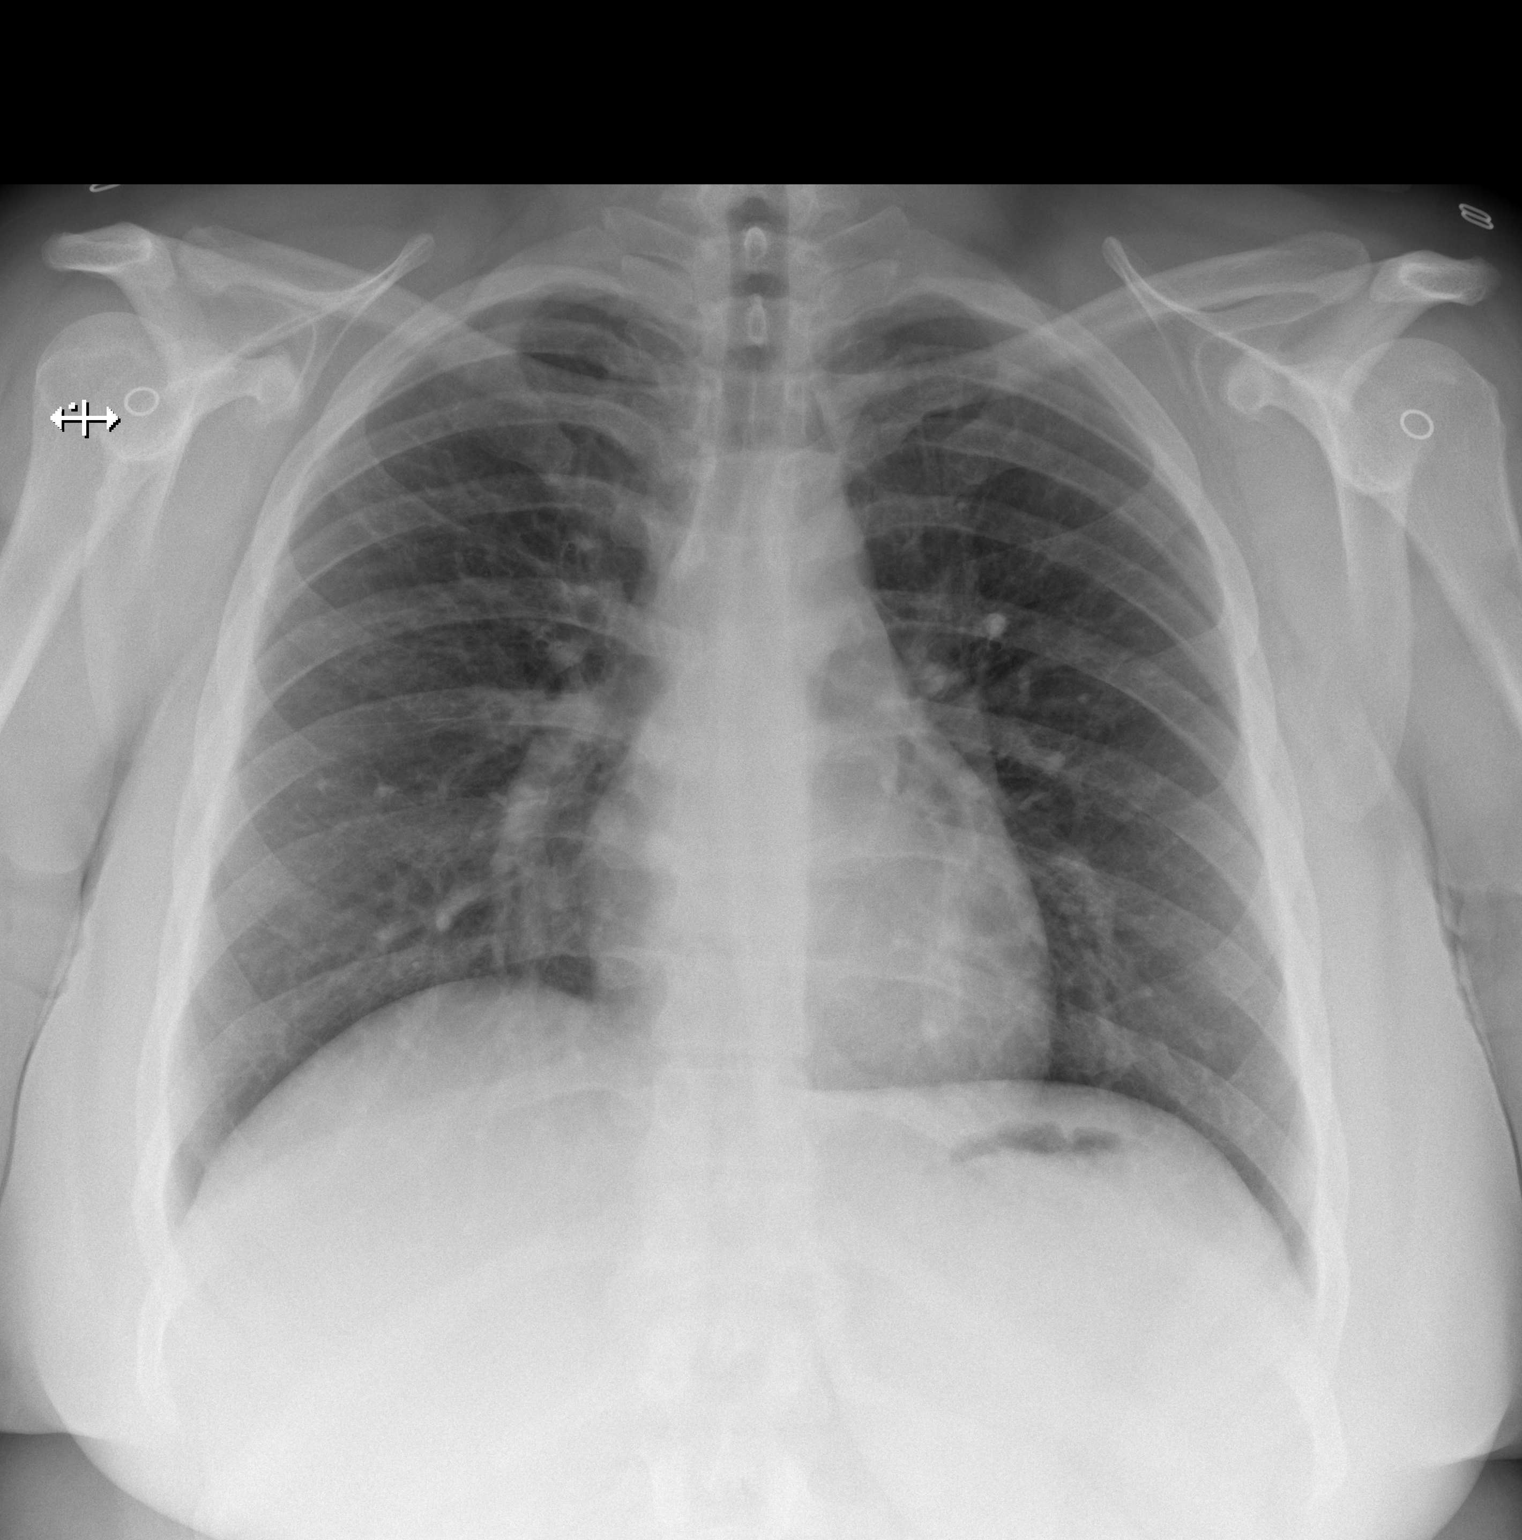

[1 of 1 positions shown; findings below may reference images not displayed]

FINDINGS: The heart size and mediastinal contours are within normal limits.
Both lungs are clear. The visualized skeletal structures are
unremarkable.
IMPRESSION: No active disease.

## 2022-03-27 ENCOUNTER — Inpatient Hospital Stay (HOSPITAL_BASED_OUTPATIENT_CLINIC_OR_DEPARTMENT_OTHER): Payer: Medicaid Other

## 2022-03-27 ENCOUNTER — Encounter (HOSPITAL_COMMUNITY): Payer: Self-pay | Admitting: *Deleted

## 2022-03-27 ENCOUNTER — Inpatient Hospital Stay (HOSPITAL_COMMUNITY)
Admission: AD | Admit: 2022-03-27 | Discharge: 2022-03-27 | Disposition: A | Discharge status: Home / Self Care | Payer: Medicaid Other | Attending: Obstetrics and Gynecology | Admitting: Obstetrics and Gynecology

## 2022-03-27 DIAGNOSIS — Z3687 Encounter for antenatal screening for uncertain dates: Secondary | ICD-10-CM | POA: Insufficient documentation

## 2022-03-27 DIAGNOSIS — Z3009 Encounter for other general counseling and advice on contraception: Secondary | ICD-10-CM

## 2022-03-27 DIAGNOSIS — O169 Unspecified maternal hypertension, unspecified trimester: Secondary | ICD-10-CM

## 2022-03-27 DIAGNOSIS — M545 Low back pain, unspecified: Secondary | ICD-10-CM | POA: Insufficient documentation

## 2022-03-27 DIAGNOSIS — O212 Late vomiting of pregnancy: Secondary | ICD-10-CM | POA: Insufficient documentation

## 2022-03-27 DIAGNOSIS — R030 Elevated blood-pressure reading, without diagnosis of hypertension: Secondary | ICD-10-CM

## 2022-03-27 DIAGNOSIS — O219 Vomiting of pregnancy, unspecified: Secondary | ICD-10-CM | POA: Diagnosis not present

## 2022-03-27 DIAGNOSIS — O26893 Other specified pregnancy related conditions, third trimester: Secondary | ICD-10-CM | POA: Insufficient documentation

## 2022-03-27 DIAGNOSIS — Z3A35 35 weeks gestation of pregnancy: Secondary | ICD-10-CM | POA: Diagnosis not present

## 2022-03-27 DIAGNOSIS — O0933 Supervision of pregnancy with insufficient antenatal care, third trimester: Secondary | ICD-10-CM

## 2022-03-27 DIAGNOSIS — Z302 Encounter for sterilization: Secondary | ICD-10-CM

## 2022-03-27 LAB — DIFFERENTIAL
Abs Immature Granulocytes: 0.14 10*3/uL — ABNORMAL HIGH (ref 0.00–0.07)
Basophils Absolute: 0.1 10*3/uL (ref 0.0–0.1)
Basophils Relative: 0 %
Eosinophils Absolute: 0.4 10*3/uL (ref 0.0–0.5)
Eosinophils Relative: 3 %
Immature Granulocytes: 1 %
Lymphocytes Relative: 12 %
Lymphs Abs: 1.7 10*3/uL (ref 0.7–4.0)
Monocytes Absolute: 0.7 10*3/uL (ref 0.1–1.0)
Monocytes Relative: 5 %
Neutro Abs: 10.8 10*3/uL — ABNORMAL HIGH (ref 1.7–7.7)
Neutrophils Relative %: 79 %

## 2022-03-27 LAB — HEPATITIS B SURFACE ANTIGEN: Hepatitis B Surface Ag: NONREACTIVE

## 2022-03-27 LAB — URINALYSIS, ROUTINE W REFLEX MICROSCOPIC
Bacteria, UA: NONE SEEN
Bilirubin Urine: NEGATIVE
Glucose, UA: NEGATIVE mg/dL
Hgb urine dipstick: NEGATIVE
Ketones, ur: NEGATIVE mg/dL
Nitrite: NEGATIVE
Protein, ur: NEGATIVE mg/dL
Specific Gravity, Urine: 1.016 (ref 1.005–1.030)
pH: 7 (ref 5.0–8.0)

## 2022-03-27 LAB — PROTEIN / CREATININE RATIO, URINE
Creatinine, Urine: 156 mg/dL
Protein Creatinine Ratio: 0.06 mg/mg{Cre} (ref 0.00–0.15)
Total Protein, Urine: 9 mg/dL

## 2022-03-27 LAB — TYPE AND SCREEN
ABO/RH(D): B POS
Antibody Screen: NEGATIVE

## 2022-03-27 LAB — COMPREHENSIVE METABOLIC PANEL
ALT: 10 U/L (ref 0–44)
AST: 13 U/L — ABNORMAL LOW (ref 15–41)
Albumin: 3 g/dL — ABNORMAL LOW (ref 3.5–5.0)
Alkaline Phosphatase: 135 U/L — ABNORMAL HIGH (ref 38–126)
Anion gap: 8 (ref 5–15)
BUN: 9 mg/dL (ref 6–20)
CO2: 23 mmol/L (ref 22–32)
Calcium: 9.2 mg/dL (ref 8.9–10.3)
Chloride: 105 mmol/L (ref 98–111)
Creatinine, Ser: 0.57 mg/dL (ref 0.44–1.00)
GFR, Estimated: >60 mL/min (ref >60)
Glucose, Bld: 79 mg/dL (ref 70–99)
Potassium: 3.4 mmol/L — ABNORMAL LOW (ref 3.5–5.1)
Sodium: 136 mmol/L (ref 135–145)
Total Bilirubin: 0.7 mg/dL (ref 0.3–1.2)
Total Protein: 6.8 g/dL (ref 6.5–8.1)

## 2022-03-27 LAB — HIV ANTIBODY (ROUTINE TESTING W REFLEX): HIV Screen 4th Generation wRfx: NONREACTIVE

## 2022-03-27 LAB — CBC
HCT: 33.5 % — ABNORMAL LOW (ref 36.0–46.0)
Hemoglobin: 11.5 g/dL — ABNORMAL LOW (ref 12.0–15.0)
MCH: 30.7 pg (ref 26.0–34.0)
MCHC: 34.3 g/dL (ref 30.0–36.0)
MCV: 89.3 fL (ref 80.0–100.0)
Platelets: 284 10*3/uL (ref 150–400)
RBC: 3.75 MIL/uL — ABNORMAL LOW (ref 3.87–5.11)
RDW: 12.3 % (ref 11.5–15.5)
WBC: 13.7 10*3/uL — ABNORMAL HIGH (ref 4.0–10.5)
nRBC: 0 % (ref 0.0–0.2)

## 2022-03-27 LAB — HEMOGLOBIN A1C
Hgb A1c MFr Bld: 5.3 % (ref 4.8–5.6)
Mean Plasma Glucose: 105.41 mg/dL

## 2022-03-27 MED ORDER — PROMETHAZINE HCL 25 MG PO TABS
25.0000 mg | ORAL_TABLET | Freq: Once | ORAL | Status: AC
  Administered 2022-03-27: 25 mg via ORAL
  Filled 2022-03-27: qty 1

## 2022-03-27 MED ORDER — ONDANSETRON 4 MG PO TBDP: 4.0000 mg | ORAL_TABLET | Freq: Four times a day (QID) | ORAL | 0 refills | Status: DC | PRN

## 2022-03-27 MED ORDER — PROMETHAZINE HCL 25 MG PO TABS: 25.0000 mg | ORAL_TABLET | Freq: Four times a day (QID) | ORAL | 0 refills | Status: DC | PRN

## 2022-03-27 MED ORDER — FAMOTIDINE 20 MG PO TABS
40.0000 mg | ORAL_TABLET | Freq: Once | ORAL | Status: AC
  Administered 2022-03-27: 40 mg via ORAL
  Filled 2022-03-27: qty 2

## 2022-03-27 NOTE — MAU Note (Signed)
.  Andrea Silva is a 36 y.o. at Unknown here in MAU reporting: she has been having n/v since yesterday. Feeling light headed na having abd pain. Pt has not had prenatal care.  LMP: 11/04/21 Onset of complaint: yesterday  Pain score: 7 Vitals:   03/27/22 1142  BP: (!) 148/85  Pulse: 100  Resp: 18  Temp: 98.2 F (36.8 C)     FHT:166 Lab orders placed from triage:  UPT U/A

## 2022-03-27 NOTE — MAU Provider Note (Signed)
History     [id number]  Arrival date and time: 03/27/22 1125    Chief Complaint  Patient presents with   Nausea   Emesis     HPI Andrea Silva is a 36 y.o. at [redacted]w[redacted]d by unsure LMP with PMHx notable for six prior NSVD, who presents for nausea and vomiting.   Patient has delivered at [facility] many times, no prenatal care usually Reports she has had trouble getting into an office Today reports n/v since yesterday Also having some low back pain No vaginal bleeding, leaking fluid +FM movement   --/--/B POS (10/05 1328)  OB History     Gravida  7   Para  6   Term  6   Preterm      AB      Living  6      SAB      IAB      Ectopic      Multiple  0   Live Births  6           Past Medical History:  Diagnosis Date   Medical history non-contributory     Past Surgical History:  Procedure Laterality Date   NO PAST SURGERIES      Family History  Problem Relation Age of Onset   Anemia Mother    Cancer Mother    COPD Mother    Alcohol abuse Father    Hypertension Father    Hyperlipidemia Father    Thyroid disease Sister    Diabetes Maternal Grandmother    Cancer Maternal Grandmother     Social History   Socioeconomic History   Marital status: Single    Spouse name: Not on file   Number of children: Not on file   Years of education: Not on file   Highest education level: Not on file  Occupational History   Not on file  Tobacco Use   Smoking status: Former    Types: Cigarettes   Smokeless tobacco: Never  Vaping Use   Vaping Use: Never used  Substance and Sexual Activity   Alcohol use: No   Drug use: Yes    Types: Marijuana    Comment: "a couple months"   Sexual activity: Not Currently    Birth control/protection: None  Other Topics Concern   Not on file  Social History Narrative   Not on file   Social Determinants of Health   Financial Resource Strain: Not on file  Food Insecurity: Not on file  Transportation Needs: Not on  file  Physical Activity: Not on file  Stress: Not on file  Social Connections: Not on file  Intimate Partner Violence: Not on file    Allergies  Allergen Reactions   Latex Rash    No current facility-administered medications on file prior to encounter.   Current Outpatient Medications on File Prior to Encounter  Medication Sig Dispense Refill   Prenatal Vit-Fe Fumarate-FA (PRENATAL MULTIVITAMIN) TABS tablet Take 1 tablet by mouth daily at 12 noon.      acetaminophen (TYLENOL) 325 MG tablet Take 650 mg by mouth every 6 (six) hours as needed for moderate pain. (Patient not taking: Reported on 04/06/2020)     albuterol (PROVENTIL HFA;VENTOLIN HFA) 108 (90 Base) MCG/ACT inhaler Inhale 1-2 puffs into the lungs every 6 (six) hours as needed for wheezing or shortness of breath. (Patient not taking: Reported on 04/06/2020) 1 Inhaler 0   coconut oil OIL Apply 1 application topically as needed (nipple pain).  0   ibuprofen (ADVIL) 600 MG tablet Take 1 tablet (600 mg total) by mouth every 8 (eight) hours as needed for moderate pain or cramping. (Patient not taking: Reported on 04/06/2020) 30 tablet 0   norethindrone (ORTHO MICRONOR) 0.35 MG tablet Take 1 tablet (0.35 mg total) by mouth daily. 56 tablet 10     ROS Pertinent positives and negative per HPI, all others reviewed and negative  Physical Exam   BP 126/69   Pulse 64   Temp 98.2 F (36.8 C)   Resp 18   Ht 5\' 11"  (1.803 m)   Wt 132.9 kg   LMP 11/04/2021 (Within Weeks)   BMI 40.87 kg/m   Patient Vitals for the past 24 hrs:  BP Temp Pulse Resp Height Weight  03/27/22 1531 126/69 -- 64 -- -- --  03/27/22 1516 135/71 -- 73 -- -- --  03/27/22 1501 (!) 142/76 -- 61 -- -- --  03/27/22 1446 (!) 125/57 -- (!) 57 -- -- --  03/27/22 1301 134/81 -- 85 -- -- --  03/27/22 1246 (!) 148/84 -- 78 -- -- --  03/27/22 1231 134/82 -- 83 -- -- --  03/27/22 1216 139/69 -- 79 -- -- --  03/27/22 1159 (!) 141/81 -- 77 -- -- --  03/27/22 1145 --  -- -- -- -- 132.9 kg  03/27/22 1142 (!) 148/85 98.2 F (36.8 C) 100 18 5\' 11"  (1.803 m) --    Physical Exam Vitals reviewed.  Constitutional:      General: She is not in acute distress.    Appearance: She is well-developed. She is not diaphoretic.  Eyes:     General: No scleral icterus. Pulmonary:     Effort: Pulmonary effort is normal. No respiratory distress.  Abdominal:     General: There is no distension.     Palpations: Abdomen is soft.     Tenderness: There is no abdominal tenderness. There is no guarding or rebound.  Skin:    General: Skin is warm and dry.  Neurological:     Mental Status: She is alert.     Coordination: Coordination normal.      Cervical Exam    Bedside Ultrasound Pt informed that the ultrasound is considered a limited OB ultrasound and is not intended to be a complete ultrasound exam.  Patient also informed that the ultrasound is not being completed with the intent of assessing for fetal or placental anomalies or any pelvic abnormalities.  Explained that the purpose of today's ultrasound is to assess for  viability.  Patient acknowledges the purpose of the exam and the limitations of the study.    My interpretation: advanced gestational age, concern for possibly multi gestational pregnancy  FHT Baseline 140, moderate variability, +accels, no decels Toco: quiet Cat: I  Labs Results for orders placed or performed during the hospital encounter of 03/27/22 (from the past 24 hour(s))  Protein / creatinine ratio, urine     Status: None   Collection Time: 03/27/22 11:32 AM  Result Value Ref Range   Creatinine, Urine 156 mg/dL   Total Protein, Urine 9 mg/dL   Protein Creatinine Ratio 0.06 0.00 - 0.15 mg/mg[Cre]  Urinalysis, Routine w reflex microscopic Urine, Clean Catch     Status: Abnormal   Collection Time: 03/27/22 11:39 AM  Result Value Ref Range   Color, Urine YELLOW YELLOW   APPearance HAZY (A) CLEAR   Specific Gravity, Urine 1.016 1.005 -  1.030   pH 7.0 5.0 - 8.0  Glucose, UA NEGATIVE NEGATIVE mg/dL   Hgb urine dipstick NEGATIVE NEGATIVE   Bilirubin Urine NEGATIVE NEGATIVE   Ketones, ur NEGATIVE NEGATIVE mg/dL   Protein, ur NEGATIVE NEGATIVE mg/dL   Nitrite NEGATIVE NEGATIVE   Leukocytes,Ua TRACE (A) NEGATIVE   RBC / HPF 0-5 0 - 5 RBC/hpf   WBC, UA 6-10 0 - 5 WBC/hpf   Bacteria, UA NONE SEEN NONE SEEN   Squamous Epithelial / LPF 6-10 0 - 5   Mucus PRESENT   Comprehensive metabolic panel     Status: Abnormal   Collection Time: 03/27/22  1:28 PM  Result Value Ref Range   Sodium 136 135 - 145 mmol/L   Potassium 3.4 (L) 3.5 - 5.1 mmol/L   Chloride 105 98 - 111 mmol/L   CO2 23 22 - 32 mmol/L   Glucose, Bld 79 70 - 99 mg/dL   BUN 9 6 - 20 mg/dL   Creatinine, Ser 0.57 0.44 - 1.00 mg/dL   Calcium 9.2 8.9 - 10.3 mg/dL   Total Protein 6.8 6.5 - 8.1 g/dL   Albumin 3.0 (L) 3.5 - 5.0 g/dL   AST 13 (L) 15 - 41 U/L   ALT 10 0 - 44 U/L   Alkaline Phosphatase 135 (H) 38 - 126 U/L   Total Bilirubin 0.7 0.3 - 1.2 mg/dL   GFR, Estimated >60 >60 mL/min   Anion gap 8 5 - 15  Hemoglobin A1c     Status: None   Collection Time: 03/27/22  1:28 PM  Result Value Ref Range   Hgb A1c MFr Bld 5.3 4.8 - 5.6 %   Mean Plasma Glucose 105.41 mg/dL  Hepatitis B surface antigen     Status: None   Collection Time: 03/27/22  1:28 PM  Result Value Ref Range   Hepatitis B Surface Ag NON REACTIVE NON REACTIVE  CBC     Status: Abnormal   Collection Time: 03/27/22  1:28 PM  Result Value Ref Range   WBC 13.7 (H) 4.0 - 10.5 K/uL   RBC 3.75 (L) 3.87 - 5.11 MIL/uL   Hemoglobin 11.5 (L) 12.0 - 15.0 g/dL   HCT 33.5 (L) 36.0 - 46.0 %   MCV 89.3 80.0 - 100.0 fL   MCH 30.7 26.0 - 34.0 pg   MCHC 34.3 30.0 - 36.0 g/dL   RDW 12.3 11.5 - 15.5 %   Platelets 284 150 - 400 K/uL   nRBC 0.0 0.0 - 0.2 %  Differential     Status: Abnormal   Collection Time: 03/27/22  1:28 PM  Result Value Ref Range   Neutrophils Relative % 79 %   Neutro Abs 10.8 (H) 1.7 -  7.7 K/uL   Lymphocytes Relative 12 %   Lymphs Abs 1.7 0.7 - 4.0 K/uL   Monocytes Relative 5 %   Monocytes Absolute 0.7 0.1 - 1.0 K/uL   Eosinophils Relative 3 %   Eosinophils Absolute 0.4 0.0 - 0.5 K/uL   Basophils Relative 0 %   Basophils Absolute 0.1 0.0 - 0.1 K/uL   Immature Granulocytes 1 %   Abs Immature Granulocytes 0.14 (H) 0.00 - 0.07 K/uL  Type and screen [place]     Status: None   Collection Time: 03/27/22  1:28 PM  Result Value Ref Range   ABO/RH(D) B POS    Antibody Screen NEG    Sample Expiration      03/30/2022,2359 Performed at [facility], [address], [place], [place]  [address]   HIV Antibody (routine testing w rflx)     Status: None   Collection Time: 03/27/22  1:28 PM  Result Value Ref Range   HIV Screen 4th Generation wRfx Non Reactive Non Reactive    Imaging No results found.             MAU Course  Procedures Lab Orders         Urinalysis, Routine w reflex microscopic Urine, Clean Catch         Comprehensive metabolic panel         Protein / creatinine ratio, urine         Hemoglobin A1c         Hepatitis B surface antigen         Rubella screen         RPR         CBC         Differential         HIV Antibody (routine testing w rflx)    Meds ordered this encounter  Medications   promethazine (PHENERGAN) tablet 25 mg   famotidine (PEPCID) tablet 40 mg   ondansetron (ZOFRAN-ODT) 4 MG disintegrating tablet    Sig: Take 1 tablet (4 mg total) by mouth every 6 (six) hours as needed for nausea.    Dispense:  20 tablet    Refill:  0   promethazine (PHENERGAN) 25 MG tablet    Sig: Take 1 tablet (25 mg total) by mouth every 6 (six) hours as needed for nausea or vomiting.    Dispense:  30 tablet    Refill:  0   Imaging Orders         [place] MFM OB COMP + 85 WK     MDM moderate  Assessment and Plan  #Nausea and vomiting in pregnancy #[redacted] weeks gestation of pregnancy #Late to prenatal care Patient found to  have [redacted] week gestation pregnancy. EFW 25%, no obvious defects on limited eval. Prenatal labs drawn, message sent to schedule [facility] OB visit at [facility]. Symptoms resolved with meds.   #Elevated blood pressure Intermittent mild range BP's while in MAU. PreE labs obtained and were unremarkable. Will schedule for BP check early next week.   #Unwanted fertility BTL consent signed in MAU after informed consent was obtained.   #FWB FHT Cat I NST: Reactive   Dispo: discharged to home in stable condition.   Clarnce Flock, MD/MPH 03/27/22 3:49 PM  Allergies as of 03/27/2022       Reactions   Latex Rash        Medication List     STOP taking these medications    ibuprofen 600 MG tablet Commonly known as: ADVIL   norethindrone 0.35 MG tablet Commonly known as: Ortho Micronor       TAKE these medications    acetaminophen 325 MG tablet Commonly known as: TYLENOL Take 650 mg by mouth every 6 (six) hours as needed for moderate pain.   albuterol 108 (90 Base) MCG/ACT inhaler Commonly known as: VENTOLIN HFA Inhale 1-2 puffs into the lungs every 6 (six) hours as needed for wheezing or shortness of breath.   coconut oil Oil Apply 1 application topically as needed (nipple pain).   ondansetron 4 MG disintegrating tablet Commonly known as: ZOFRAN-ODT Take 1 tablet (4 mg total) by mouth every 6 (six) hours as needed for nausea.   prenatal multivitamin Tabs tablet Take 1 tablet by mouth daily at 12  noon.   promethazine 25 MG tablet Commonly known as: PHENERGAN Take 1 tablet (25 mg total) by mouth every 6 (six) hours as needed for nausea or vomiting.

## 2022-03-28 LAB — RPR: RPR Ser Ql: NONREACTIVE

## 2022-03-29 LAB — RUBELLA SCREEN: Rubella: 2.12 index (ref >0.99)

## 2022-03-31 ENCOUNTER — Encounter: Payer: Self-pay | Admitting: Family Medicine

## 2022-03-31 ENCOUNTER — Other Ambulatory Visit: Payer: Self-pay

## 2022-03-31 ENCOUNTER — Ambulatory Visit (INDEPENDENT_AMBULATORY_CARE_PROVIDER_SITE_OTHER): Payer: Medicaid Other | Admitting: Family Medicine

## 2022-03-31 VITALS — BP 140/78 | HR 81 | Wt 296.3 lb

## 2022-03-31 DIAGNOSIS — O0993 Supervision of high risk pregnancy, unspecified, third trimester: Secondary | ICD-10-CM | POA: Diagnosis not present

## 2022-03-31 DIAGNOSIS — O0933 Supervision of pregnancy with insufficient antenatal care, third trimester: Secondary | ICD-10-CM | POA: Diagnosis not present

## 2022-03-31 DIAGNOSIS — Z3009 Encounter for other general counseling and advice on contraception: Secondary | ICD-10-CM

## 2022-03-31 DIAGNOSIS — O133 Gestational [pregnancy-induced] hypertension without significant proteinuria, third trimester: Secondary | ICD-10-CM

## 2022-03-31 DIAGNOSIS — Z3A35 35 weeks gestation of pregnancy: Secondary | ICD-10-CM | POA: Diagnosis not present

## 2022-03-31 DIAGNOSIS — O0990 Supervision of high risk pregnancy, unspecified, unspecified trimester: Secondary | ICD-10-CM | POA: Insufficient documentation

## 2022-03-31 NOTE — Progress Notes (Signed)
   PRENATAL VISIT NOTE  Subjective:  Andrea Silva is a 36 y.o. G7P6006 at [redacted]w[redacted]d being seen today for ongoing prenatal care.  She is currently monitored for the following issues for this high-risk pregnancy and has Prenatal care insufficient, third trimester; Unwanted fertility; Elevated BP without diagnosis of hypertension; and Supervision of high risk pregnancy, antepartum on their problem list.  Patient reports no complaints.  Contractions: Not present. Vag. Bleeding: None.  Movement: Present. Denies leaking of fluid.   The following portions of the patient's history were reviewed and updated as appropriate: allergies, current medications, past family history, past medical history, past social history, past surgical history and problem list.   Objective:   Vitals:   03/31/22 1544  BP: (!) 140/78  Pulse: 81  Weight: 296 lb 4.8 oz (134.4 kg)    Fetal Status: Fetal Heart Rate (bpm): 148   Movement: Present     General:  Alert, oriented and cooperative. Patient is in no acute distress.  Skin: Skin is warm and dry. No rash noted.   Cardiovascular: Normal heart rate noted  Respiratory: Normal respiratory effort, no problems with respiration noted  Abdomen: Soft, gravid, appropriate for gestational age.  Pain/Pressure: Present     Pelvic: Cervical exam deferred        Extremities: Normal range of motion.  Edema: None  Mental Status: Normal mood and affect. Normal behavior. Normal judgment and thought content.   Assessment and Plan:  Pregnancy: [id number] at [redacted]w[redacted]d 1. Supervision of high risk pregnancy, antepartum No acute concerns.  - [place] MFM OB DETAIL +14 WK; Future  2. Gestational hypertension, third trimester BP elevated in MAU, PreE labs reviewed and normal. Today mild range. Consider induction at 37-37.6 weeks. Strict return precautions given. No prior hx of HTN.   3. [redacted] weeks gestation of pregnancy GBS/GC/Ch next week.   4. Prenatal care insufficient, third  trimester Reviewed OB labs that were done in MAU.   5. Unwanted fertility BTS papers signed in MAU per patient and MAU provider. Not in media tab.    Preterm labor symptoms and general obstetric precautions including but not limited to vaginal bleeding, contractions, leaking of fluid and fetal movement were reviewed in detail with the patient. Please refer to After Visit Summary for other counseling recommendations.   Return in about 1 week (around 04/07/2022) for HROB follow up.  Future Appointments  Date Time Provider [place]  04/03/2022  8:15 AM [facility] NURSE [facility]  04/03/2022  8:30 AM [facility] US3 [facility]  04/09/2022  3:55 PM Anyanwu, Sallyanne Havers, MD [facility]  04/21/2022  2:55 PM Aletha Halim, MD [facility] [place], DO

## 2022-04-01 ENCOUNTER — Encounter: Payer: Self-pay | Admitting: *Deleted

## 2022-04-03 ENCOUNTER — Ambulatory Visit: Payer: Medicaid Other | Admitting: *Deleted

## 2022-04-03 ENCOUNTER — Encounter: Payer: Self-pay | Admitting: *Deleted

## 2022-04-03 ENCOUNTER — Other Ambulatory Visit: Payer: Self-pay | Admitting: Family Medicine

## 2022-04-03 ENCOUNTER — Ambulatory Visit: Payer: Medicaid Other | Attending: Family Medicine

## 2022-04-03 VITALS — BP 141/79 | HR 87

## 2022-04-03 DIAGNOSIS — O133 Gestational [pregnancy-induced] hypertension without significant proteinuria, third trimester: Secondary | ICD-10-CM | POA: Insufficient documentation

## 2022-04-03 DIAGNOSIS — O0943 Supervision of pregnancy with grand multiparity, third trimester: Secondary | ICD-10-CM

## 2022-04-03 DIAGNOSIS — O0933 Supervision of pregnancy with insufficient antenatal care, third trimester: Secondary | ICD-10-CM | POA: Insufficient documentation

## 2022-04-03 DIAGNOSIS — E669 Obesity, unspecified: Secondary | ICD-10-CM | POA: Diagnosis not present

## 2022-04-03 DIAGNOSIS — O99213 Obesity complicating pregnancy, third trimester: Secondary | ICD-10-CM | POA: Insufficient documentation

## 2022-04-03 DIAGNOSIS — O0990 Supervision of high risk pregnancy, unspecified, unspecified trimester: Secondary | ICD-10-CM | POA: Diagnosis present

## 2022-04-03 DIAGNOSIS — Z3A36 36 weeks gestation of pregnancy: Secondary | ICD-10-CM

## 2022-04-03 NOTE — Procedures (Signed)
Perian Tedder 04-08-1986 [redacted]w[redacted]d  Fetus A Non-Stress Test Interpretation for 04/03/22  Indication: Unsatisfactory BPP  Fetal Heart Rate A Mode: External Baseline Rate (A): 155 bpm Variability: Moderate Accelerations: 15 x 15 Decelerations: None Multiple birth?: No  Uterine Activity Mode: Toco Contraction Frequency (min): none Resting Tone Palpated: Relaxed  Interpretation (Fetal Testing) Nonstress Test Interpretation: Reactive Overall Impression: Reassuring for gestational age Comments: Tracing reviewed by Dr. Gertie Exon

## 2022-04-09 ENCOUNTER — Other Ambulatory Visit: Payer: Self-pay

## 2022-04-09 ENCOUNTER — Other Ambulatory Visit (HOSPITAL_COMMUNITY)
Admission: RE | Admit: 2022-04-09 | Discharge: 2022-04-09 | Disposition: A | Discharge status: Home / Self Care | Payer: Medicaid Other | Source: Ambulatory Visit | Attending: Obstetrics & Gynecology | Admitting: Obstetrics & Gynecology

## 2022-04-09 ENCOUNTER — Encounter: Payer: Self-pay | Admitting: Obstetrics & Gynecology

## 2022-04-09 ENCOUNTER — Ambulatory Visit (INDEPENDENT_AMBULATORY_CARE_PROVIDER_SITE_OTHER): Payer: Medicaid Other | Admitting: Obstetrics & Gynecology

## 2022-04-09 VITALS — BP 141/83 | HR 81

## 2022-04-09 DIAGNOSIS — O0990 Supervision of high risk pregnancy, unspecified, unspecified trimester: Secondary | ICD-10-CM | POA: Diagnosis present

## 2022-04-09 DIAGNOSIS — Z3A37 37 weeks gestation of pregnancy: Secondary | ICD-10-CM | POA: Insufficient documentation

## 2022-04-09 DIAGNOSIS — O133 Gestational [pregnancy-induced] hypertension without significant proteinuria, third trimester: Secondary | ICD-10-CM | POA: Diagnosis not present

## 2022-04-09 DIAGNOSIS — O0993 Supervision of high risk pregnancy, unspecified, third trimester: Secondary | ICD-10-CM

## 2022-04-09 NOTE — Progress Notes (Signed)
PRENATAL VISIT NOTE  Subjective:  Andrea Silva is a 36 y.o. G7P6006 at [redacted]w[redacted]d being seen today for ongoing prenatal care.  She is currently monitored for the following issues for this high-risk pregnancy and has Prenatal care insufficient, third trimester; Unwanted fertility; Elevated BP without diagnosis of hypertension; and Supervision of high risk pregnancy, antepartum on their problem list.  Patient reports no complaints.  Patient denies any headaches, visual symptoms, RUQ/epigastric pain or other concerning symptoms.  Contractions: Irritability. Vag. Bleeding: None.  Movement: Present. Denies leaking of fluid.   The following portions of the patient's history were reviewed and updated as appropriate: allergies, current medications, past family history, past medical history, past social history, past surgical history and problem list.   Objective:   Vitals:   04/09/22 1557 04/09/22 1613  BP: (!) 148/84 (!) 141/83  Pulse: 81     Fetal Status: Fetal Heart Rate (bpm): 155   Movement: Present  Presentation: Vertex  General:  Alert, oriented and cooperative. Patient is in no acute distress.  Skin: Skin is warm and dry. No rash noted.   Cardiovascular: Normal heart rate noted  Respiratory: Normal respiratory effort, no problems with respiration noted  Abdomen: Soft, gravid, appropriate for gestational age.  Pain/Pressure: Present     Pelvic: Cervical exam performed in the presence of a chaperone Dilation: 1 Effacement (%): 50 Station: Ballotable  Extremities: Normal range of motion.  Edema: None  Mental Status: Normal mood and affect. Normal behavior. Normal judgment and thought content.   [place] MFM FETAL BPP W/NONSTRESS  Result Date: 04/03/2022 ----------------------------------------------------------------------  OBSTETRICS REPORT                       (Signed Final 04/03/2022 10:36 am) ---------------------------------------------------------------------- Patient Info  ID #:        [id number]                          D.O.B.:  November 23, 1985 (36 yrs)  Name:       Andrea Silva                Visit Date: 04/03/2022 09:52 am ---------------------------------------------------------------------- Performed By  Attending:        Sander Nephew      Ref. Address:     [address]                    MD                                                             [place], [place]  Performed By:     Rodrigo Ran BS      [facility]  Referred By:      [facility] ---------------------------------------------------------------------- Orders  #  Description                           Code        Ordered By  1  [place] MFM FETAL BPP                      54650.3     Tinnie Gens     W/NONSTRESS ----------------------------------------------------------------------  #  Order #                     Accession #                Episode #  1  [id number]                   [id number]                 [id number] ---------------------------------------------------------------------- Indications  [redacted] weeks gestation of pregnancy                Z3A.36  Gestational hypertension without significant   O13.3  proteinuria, third trimester  Obesity complicating pregnancy, third          O99.213  trimester  Late to prenatal care, third trimester         O09.33  Grand multiparity, antepartum                  O09.40 ---------------------------------------------------------------------- Fetal Evaluation  Num Of Fetuses:         1  Fetal Heart Rate(bpm):  150  Cardiac Activity:       Observed  Presentation:           Cephalic  Placenta:               Anterior  P. Cord Insertion:      Visualized  Amniotic Fluid   AFI FV:      Within normal limits  AFI Sum(cm)     %Tile       Largest Pocket(cm)  8.1             8           5.8  RUQ(cm)       RLQ(cm)  5.8           2.3 ---------------------------------------------------------------------- Biophysical Evaluation  Amniotic F.V:   Within normal limits       F. Tone:        Observed  F. Movement:    Not  Observed               N.S.T:          Reactive  F. Breathing:   Observed                   Score:          8/10 ---------------------------------------------------------------------- Biometry  LV:        5.6  mm ---------------------------------------------------------------------- OB History  Gravidity:    7         Term:   6        Prem:   0        SAB:   0  TOP:          0       Ectopic:  0        Living: 6 ---------------------------------------------------------------------- Gestational Age  LMP:           21w 3d        Date:  11/04/21                  EDD:   08/11/22  Best:          36w 1d     Det. By:  U/S  (03/27/22)          EDD:   04/30/22 ---------------------------------------------------------------------- Anatomy  Cranium:               Appears normal         LVOT:                   Appears normal  Cavum:                 Previously seen        Aortic Arch:            Not well visualized  Ventricles:            Appears normal         Ductal Arch:            Not well visualized  Choroid Plexus:        Not well visualized    Diaphragm:              Appears normal  Cerebellum:            Not well visualized    Stomach:                Appears normal, left                                                                        sided  Posterior Fossa:       Not well visualized    Abdomen:                Appears normal  Nuchal Fold:           Not applicable (>20    Abdominal Wall:         Not well visualized                         wks GA)  Face:  Profile nl; orbits not Cord Vessels:           Not well visualized                         well visualized  Lips:                   Appears normal         Kidneys:                Appear normal  Palate:                Not well visualized    Bladder:                Appears normal  Thoracic:              Appears normal         Spine:                  Previously seen  Heart:                 Not well visualized    Upper Extremities:      Not well visualized  RVOT:                  Appears normal         Lower Extremities:      Not well visualized  Other:  Technically difficult due to maternal habitus, advanced GA and fetal          position.Nasal bone and left heel visualized. ---------------------------------------------------------------------- Cervix Uterus Adnexa  Cervix  Not visualized (advanced GA >24wks)  Uterus  No abnormality visualized.  Right Ovary  Not visualized.  Left Ovary  Not visualized.  Cul De Sac  No free fluid seen.  Adnexa  No abnormality visualized. ---------------------------------------------------------------------- Impression  Antenatal testing performed given maternal GHTN without  proteninuria  The biophysical profile was 8/10 with suboptimal fetal  movement and amniotic fluid volume.  Suboptimal views of the fetal anatomy was again seen  secondary to advanced gestational age. ---------------------------------------------------------------------- Recommendations  Continue weekly testing ----------------------------------------------------------------------               Sander Nephew, MD Electronically Signed Final Report   04/03/2022 10:36 am ----------------------------------------------------------------------  [place] MFM OB COMP + 14 WK  Result Date: 03/27/2022 ----------------------------------------------------------------------  OBSTETRICS REPORT                        (Signed Final 03/27/2022 06:25 pm) ---------------------------------------------------------------------- Patient Info  ID #:       [id number]                          D.O.B.:  07-15-1985 (36 yrs)  Name:       Andrea Silva                 Visit Date: 03/27/2022 02:34 pm ---------------------------------------------------------------------- Performed By  Attending:        Sander Nephew      Ref. Address:      [address]                    [place]  [place], [place]  Performed By:     Berlinda Last          Secondary Phy.:    [facility] MAU/Triage                    RDMS  Referred By:      Annice Needy              [facility] [place]                                [place] ---------------------------------------------------------------------- Orders  #  Description                           Code        Ordered By  1  [place] MFM OB COMP + 12 WK                76805.01    MATTHEW ECKSTAT ----------------------------------------------------------------------  #  Order #                     Accession #                Episode #  1  [id number]                   [id number]                 [id number] ---------------------------------------------------------------------- Indications  Late to prenatal care, third trimester          O09.33  [redacted] weeks gestation of pregnancy                 Z3A.35  Encounter for uncertain dates                   Z36.87  Encounter for antenatal screening for           Z36.3  malformations ---------------------------------------------------------------------- Fetal Evaluation  Num Of Fetuses:          1  Fetal Heart Rate(bpm):   154  Cardiac Activity:        Observed  Presentation:            Cephalic  Placenta:                Anterior  P. Cord Insertion:       Not well visualized  Amniotic Fluid  AFI FV:      Within normal limits  AFI Sum(cm)     %Tile       Largest Pocket(cm)  12.3            38          3.6  RUQ(cm)       RLQ(cm)       LUQ(cm)        LLQ(cm)  3.1           3.6           3               2.6 ---------------------------------------------------------------------- Biometry  BPD:      89.6  mm     G. Age:  36w 2d         82  %    CI:        76.77   %    70 - 86                                                          FL/HC:       20.0  %    20.1 - 22.3  HC:      323.9  mm     G. Age:  36w 5d         54  %    HC/AC:       1.08       0.93 - 1.11  AC:      300.6  mm     G. Age:  34w 0d         25  %    FL/BPD:      72.3  %    71 - 87  FL:       64.8  mm     G. Age:  33w 3d          8  %    FL/AC:       21.6  %    20 - 24  HUM:      61.5  mm     G. Age:  35w 4d         75  %  Est. FW:    2406   gm     5 lb 5 oz     25  % ---------------------------------------------------------------------- OB History  Gravidity:    7         Term:   6        Prem:   0        SAB:   0  TOP:          0       Ectopic:  0        Living: 6 ---------------------------------------------------------------------- Gestational Age  LMP:           20w 3d        Date:  11/04/21                  EDD:   08/11/22  U/S Today:     35w 1d  EDD:   04/30/22  Best:          35w 1d     Det. By:  U/S (03/27/22)           EDD:   04/30/22 ---------------------------------------------------------------------- Anatomy  Cranium:               Appears normal         LVOT:                   Not well visualized  Cavum:                 Appears normal         Aortic Arch:            Not well visualized  Ventricles:            Appears normal         Ductal Arch:            Not well visualized  Choroid Plexus:        Not well visualized    Diaphragm:              Appears normal  Cerebellum:            Not well visualized    Stomach:                Appears normal, left                                                                        sided  Posterior Fossa:       Not well visualized    Abdomen:                Appears normal  Nuchal Fold:           Not applicable ([id number]    Abdominal Wall:         Not well  visualized                         wks GA)  Face:                  Not well visualized    Cord Vessels:           Not well visualized  Lips:                  Not well visualized    Kidneys:                Appear normal  Palate:                Not well visualized    Bladder:                Appears normal  Thoracic:              Appears normal         Spine:                  Appears normal  Heart:                 Not well visualized  Upper Extremities:      Not well visualized  RVOT:                  Not well visualized    Lower Extremities:      Not well visualized  Other:  Technically difficult due to advanced GA and fetal position.          Technically difficult due to maternal habitus. ---------------------------------------------------------------------- Cervix Uterus Adnexa  Cervix  Not visualized (advanced GA >24wks)  Uterus  No abnormality visualized.  Adnexa  No abnormality visualized. ---------------------------------------------------------------------- Impression  Single intrauterine pregnancy here for a complete anatomy  due to uncertain dates.  Normal anatomy with measurements consistent with dates  There is good fetal movement and amniotic fluid volume  Suboptimal views of the fetal anatomy were obtained  secondary to fetal position and advanced gestational age. ---------------------------------------------------------------------- Recommendations  Clinical correlation recommended. ----------------------------------------------------------------------              Sander Nephew, MD Electronically Signed Final Report   03/27/2022 06:25 pm ----------------------------------------------------------------------   Assessment and Plan:  Pregnancy: A1P3790 at [redacted]w[redacted]d 1. Gestational hypertension, third trimester Persistently elevated BPs. IOL scheduled for 04/10/22. NST to be done today. No severe features, precautions reviewed. NST to be done prior to leaving today.   - Fetal nonstress test - CBC -  Comprehensive metabolic panel - Protein / creatinine ratio, urine   2. [redacted] weeks gestation of pregnancy 3. Supervision of high risk pregnancy, antepartum Pelvic cultures done. - Cervicovaginal ancillary only - Strep Gp B NAA Preterm labor symptoms and general obstetric precautions including but not limited to vaginal bleeding, contractions, leaking of fluid and fetal movement were reviewed in detail with the patient. Please refer to After Visit Summary for other counseling recommendations.   Return for Postpartum check.  Future Appointments  Date Time Provider [place]  04/10/2022  7:00 AM [facility] SCHED ROOM [facility] None    Verita Schneiders, MD

## 2022-04-09 NOTE — Patient Instructions (Signed)
Return to office for any scheduled appointments. Call the office or go to the MAU at [facility] if: You begin to have strong, frequent contractions Your water breaks.  Sometimes it is a big gush of fluid, sometimes it is just a trickle that keeps getting your underwear wet or running down your legs You have vaginal bleeding.  It is normal to have a small amount of spotting if your cervix was checked.  You do not feel your baby moving like normal.  If you do not, get something to eat and drink and lay down and focus on feeling your baby move.   If your baby is still not moving like normal, you should call the office or go to MAU. Any other obstetric concerns.  

## 2022-04-10 ENCOUNTER — Inpatient Hospital Stay (HOSPITAL_COMMUNITY): Payer: Medicaid Other

## 2022-04-10 LAB — COMPREHENSIVE METABOLIC PANEL
ALT: 8 IU/L (ref 0–32)
AST: 12 IU/L (ref 0–40)
Albumin/Globulin Ratio: 1.3 (ref 1.2–2.2)
Albumin: 3.5 g/dL — ABNORMAL LOW (ref 3.9–4.9)
Alkaline Phosphatase: 183 IU/L — ABNORMAL HIGH (ref 44–121)
BUN/Creatinine Ratio: 11 (ref 9–23)
BUN: 8 mg/dL (ref 6–20)
Bilirubin Total: 0.5 mg/dL (ref 0.0–1.2)
CO2: 18 mmol/L — ABNORMAL LOW (ref 20–29)
Calcium: 9.6 mg/dL (ref 8.7–10.2)
Chloride: 104 mmol/L (ref 96–106)
Creatinine, Ser: 0.75 mg/dL (ref 0.57–1.00)
Globulin, Total: 2.8 g/dL (ref 1.5–4.5)
Glucose: 93 mg/dL (ref 70–99)
Potassium: 4.2 mmol/L (ref 3.5–5.2)
Sodium: 137 mmol/L (ref 134–144)
Total Protein: 6.3 g/dL (ref 6.0–8.5)
eGFR: 107 mL/min/{1.73_m2} (ref >59)

## 2022-04-10 LAB — CBC
Hematocrit: 32.6 % — ABNORMAL LOW (ref 34.0–46.6)
Hemoglobin: 11.1 g/dL (ref 11.1–15.9)
MCH: 29.8 pg (ref 26.6–33.0)
MCHC: 34 g/dL (ref 31.5–35.7)
MCV: 87 fL (ref 79–97)
Platelets: 302 10*3/uL (ref 150–450)
RBC: 3.73 x10E6/uL — ABNORMAL LOW (ref 3.77–5.28)
RDW: 12.1 % (ref 11.7–15.4)
WBC: 12.7 10*3/uL — ABNORMAL HIGH (ref 3.4–10.8)

## 2022-04-10 LAB — PROTEIN / CREATININE RATIO, URINE
Creatinine, Urine: 179.9 mg/dL
Protein, Ur: 24.9 mg/dL
Protein/Creat Ratio: 138 mg/g creat (ref 0–200)

## 2022-04-11 ENCOUNTER — Encounter (HOSPITAL_COMMUNITY): Payer: Self-pay | Admitting: Obstetrics & Gynecology

## 2022-04-11 ENCOUNTER — Inpatient Hospital Stay (HOSPITAL_COMMUNITY)
Admission: RE | Admit: 2022-04-11 | Discharge: 2022-04-13 | DRG: 806 | Disposition: A | Discharge status: Home / Self Care | Payer: Medicaid Other | Attending: Obstetrics and Gynecology | Admitting: Obstetrics and Gynecology

## 2022-04-11 DIAGNOSIS — O9081 Anemia of the puerperium: Secondary | ICD-10-CM | POA: Diagnosis not present

## 2022-04-11 DIAGNOSIS — O133 Gestational [pregnancy-induced] hypertension without significant proteinuria, third trimester: Secondary | ICD-10-CM

## 2022-04-11 DIAGNOSIS — Z3A37 37 weeks gestation of pregnancy: Secondary | ICD-10-CM

## 2022-04-11 DIAGNOSIS — O139 Gestational [pregnancy-induced] hypertension without significant proteinuria, unspecified trimester: Secondary | ICD-10-CM | POA: Diagnosis present

## 2022-04-11 DIAGNOSIS — Z20822 Contact with and (suspected) exposure to covid-19: Secondary | ICD-10-CM | POA: Diagnosis present

## 2022-04-11 DIAGNOSIS — Z3009 Encounter for other general counseling and advice on contraception: Secondary | ICD-10-CM

## 2022-04-11 DIAGNOSIS — D62 Acute posthemorrhagic anemia: Secondary | ICD-10-CM | POA: Diagnosis not present

## 2022-04-11 DIAGNOSIS — O134 Gestational [pregnancy-induced] hypertension without significant proteinuria, complicating childbirth: Principal | ICD-10-CM | POA: Diagnosis present

## 2022-04-11 DIAGNOSIS — Z87891 Personal history of nicotine dependence: Secondary | ICD-10-CM

## 2022-04-11 DIAGNOSIS — O0990 Supervision of high risk pregnancy, unspecified, unspecified trimester: Principal | ICD-10-CM

## 2022-04-11 DIAGNOSIS — R030 Elevated blood-pressure reading, without diagnosis of hypertension: Secondary | ICD-10-CM

## 2022-04-11 DIAGNOSIS — O0933 Supervision of pregnancy with insufficient antenatal care, third trimester: Secondary | ICD-10-CM

## 2022-04-11 DIAGNOSIS — Z8759 Personal history of other complications of pregnancy, childbirth and the puerperium: Secondary | ICD-10-CM | POA: Diagnosis present

## 2022-04-11 LAB — COMPREHENSIVE METABOLIC PANEL
ALT: 11 U/L (ref 0–44)
AST: 18 U/L (ref 15–41)
Albumin: 2.8 g/dL — ABNORMAL LOW (ref 3.5–5.0)
Alkaline Phosphatase: 148 U/L — ABNORMAL HIGH (ref 38–126)
Anion gap: 13 (ref 5–15)
BUN: 8 mg/dL (ref 6–20)
CO2: 21 mmol/L — ABNORMAL LOW (ref 22–32)
Calcium: 9.6 mg/dL (ref 8.9–10.3)
Chloride: 103 mmol/L (ref 98–111)
Creatinine, Ser: 0.6 mg/dL (ref 0.44–1.00)
GFR, Estimated: >60 mL/min (ref >60)
Glucose, Bld: 121 mg/dL — ABNORMAL HIGH (ref 70–99)
Potassium: 3.4 mmol/L — ABNORMAL LOW (ref 3.5–5.1)
Sodium: 137 mmol/L (ref 135–145)
Total Bilirubin: 0.7 mg/dL (ref 0.3–1.2)
Total Protein: 6.4 g/dL — ABNORMAL LOW (ref 6.5–8.1)

## 2022-04-11 LAB — PROTEIN / CREATININE RATIO, URINE
Creatinine, Urine: 313 mg/dL
Protein Creatinine Ratio: 0.08 mg/mg{Cre} (ref 0.00–0.15)
Total Protein, Urine: 26 mg/dL

## 2022-04-11 LAB — CBC
HCT: 31.6 % — ABNORMAL LOW (ref 36.0–46.0)
HCT: 29.9 % — ABNORMAL LOW (ref 36.0–46.0)
Hemoglobin: 10.7 g/dL — ABNORMAL LOW (ref 12.0–15.0)
Hemoglobin: 10.2 g/dL — ABNORMAL LOW (ref 12.0–15.0)
MCH: 29.7 pg (ref 26.0–34.0)
MCH: 30.1 pg (ref 26.0–34.0)
MCHC: 33.9 g/dL (ref 30.0–36.0)
MCHC: 34.1 g/dL (ref 30.0–36.0)
MCV: 87.8 fL (ref 80.0–100.0)
MCV: 88.2 fL (ref 80.0–100.0)
Platelets: 284 10*3/uL (ref 150–400)
Platelets: 251 10*3/uL (ref 150–400)
RBC: 3.6 MIL/uL — ABNORMAL LOW (ref 3.87–5.11)
RBC: 3.39 MIL/uL — ABNORMAL LOW (ref 3.87–5.11)
RDW: 12.1 % (ref 11.5–15.5)
RDW: 12.2 % (ref 11.5–15.5)
WBC: 12.5 10*3/uL — ABNORMAL HIGH (ref 4.0–10.5)
WBC: 11.9 10*3/uL — ABNORMAL HIGH (ref 4.0–10.5)
nRBC: 0 % (ref 0.0–0.2)
nRBC: 0 % (ref 0.0–0.2)

## 2022-04-11 LAB — TYPE AND SCREEN
ABO/RH(D): B POS
Antibody Screen: NEGATIVE

## 2022-04-11 LAB — RESP PANEL BY RT-PCR (FLU A&B, COVID) ARPGX2
Influenza A by PCR: NEGATIVE
Influenza B by PCR: NEGATIVE
SARS Coronavirus 2 by RT PCR: NEGATIVE

## 2022-04-11 LAB — CERVICOVAGINAL ANCILLARY ONLY
Chlamydia: NEGATIVE
Comment: NEGATIVE
Comment: NORMAL
Neisseria Gonorrhea: NEGATIVE

## 2022-04-11 LAB — STREP GP B NAA: Strep Gp B NAA: NEGATIVE

## 2022-04-11 MED ORDER — ONDANSETRON HCL 4 MG/2ML IJ SOLN: 4.0000 mg | Freq: Four times a day (QID) | INTRAMUSCULAR | Status: DC | PRN

## 2022-04-11 MED ORDER — LACTATED RINGERS IV SOLN: 500.0000 mL | Freq: Once | INTRAVENOUS | Status: DC

## 2022-04-11 MED ORDER — TRANEXAMIC ACID-NACL 1000-0.7 MG/100ML-% IV SOLN
INTRAVENOUS | Status: AC
  Administered 2022-04-11: 1000 mg via INTRAVENOUS
  Filled 2022-04-11: qty 100

## 2022-04-11 MED ORDER — HYDROXYZINE HCL 50 MG PO TABS: 50.0000 mg | ORAL_TABLET | Freq: Four times a day (QID) | ORAL | Status: DC | PRN

## 2022-04-11 MED ORDER — OXYCODONE-ACETAMINOPHEN 5-325 MG PO TABS: 2.0000 | ORAL_TABLET | ORAL | Status: DC | PRN

## 2022-04-11 MED ORDER — PHENYLEPHRINE 80 MCG/ML (10ML) SYRINGE FOR IV PUSH (FOR BLOOD PRESSURE SUPPORT): 80.0000 ug | PREFILLED_SYRINGE | INTRAVENOUS | Status: DC | PRN

## 2022-04-11 MED ORDER — TERBUTALINE SULFATE 1 MG/ML IJ SOLN: 0.2500 mg | Freq: Once | INTRAMUSCULAR | Status: DC | PRN

## 2022-04-11 MED ORDER — OXYCODONE-ACETAMINOPHEN 5-325 MG PO TABS: 1.0000 | ORAL_TABLET | ORAL | Status: DC | PRN

## 2022-04-11 MED ORDER — FENTANYL-BUPIVACAINE-NACL 0.5-0.125-0.9 MG/250ML-% EP SOLN: 12.0000 mL/h | EPIDURAL | Status: DC | PRN

## 2022-04-11 MED ORDER — OXYTOCIN-SODIUM CHLORIDE 30-0.9 UT/500ML-% IV SOLN
2.5000 [IU]/h | INTRAVENOUS | Status: DC
  Filled 2022-04-11: qty 500

## 2022-04-11 MED ORDER — ZOLPIDEM TARTRATE 5 MG PO TABS: 5.0000 mg | ORAL_TABLET | Freq: Every evening | ORAL | Status: DC | PRN

## 2022-04-11 MED ORDER — EPHEDRINE 5 MG/ML INJ: 10.0000 mg | INTRAVENOUS | Status: DC | PRN

## 2022-04-11 MED ORDER — ACETAMINOPHEN 500 MG PO TABS
ORAL_TABLET | ORAL | Status: AC
  Filled 2022-04-11: qty 2

## 2022-04-11 MED ORDER — LIDOCAINE HCL (PF) 1 % IJ SOLN: 30.0000 mL | INTRAMUSCULAR | Status: DC | PRN

## 2022-04-11 MED ORDER — DIPHENHYDRAMINE HCL 50 MG/ML IJ SOLN: 12.5000 mg | INTRAMUSCULAR | Status: DC | PRN

## 2022-04-11 MED ORDER — LACTATED RINGERS IV SOLN
500.0000 mL | INTRAVENOUS | Status: DC | PRN
  Administered 2022-04-11: 1000 mL via INTRAVENOUS

## 2022-04-11 MED ORDER — ACETAMINOPHEN 325 MG PO TABS: 650.0000 mg | ORAL_TABLET | ORAL | Status: DC | PRN

## 2022-04-11 MED ORDER — FENTANYL CITRATE (PF) 100 MCG/2ML IJ SOLN
50.0000 ug | INTRAMUSCULAR | Status: DC | PRN
  Administered 2022-04-11: 100 ug via INTRAVENOUS
  Filled 2022-04-11: qty 2

## 2022-04-11 MED ORDER — SOD CITRATE-CITRIC ACID 500-334 MG/5ML PO SOLN: 30.0000 mL | ORAL | Status: DC | PRN

## 2022-04-11 MED ORDER — OXYTOCIN-SODIUM CHLORIDE 30-0.9 UT/500ML-% IV SOLN: 1.0000 m[IU]/min | INTRAVENOUS | Status: DC

## 2022-04-11 MED ORDER — FLEET ENEMA 7-19 GM/118ML RE ENEM: 1.0000 | ENEMA | Freq: Every day | RECTAL | Status: DC | PRN

## 2022-04-11 MED ORDER — MISOPROSTOL 50MCG HALF TABLET
50.0000 ug | ORAL_TABLET | Freq: Once | ORAL | Status: AC
  Administered 2022-04-11: 50 ug via ORAL
  Filled 2022-04-11: qty 1

## 2022-04-11 MED ORDER — ACETAMINOPHEN 500 MG PO TABS
1000.0000 mg | ORAL_TABLET | Freq: Once | ORAL | Status: AC
  Administered 2022-04-11: 1000 mg via ORAL

## 2022-04-11 MED ORDER — OXYTOCIN BOLUS FROM INFUSION
333.0000 mL | Freq: Once | INTRAVENOUS | Status: AC
  Administered 2022-04-11: 333 mL via INTRAVENOUS

## 2022-04-11 MED ORDER — LACTATED RINGERS IV SOLN: INTRAVENOUS | Status: DC

## 2022-04-11 MED ORDER — TRANEXAMIC ACID-NACL 1000-0.7 MG/100ML-% IV SOLN: 1000.0000 mg | Freq: Once | INTRAVENOUS | Status: AC

## 2022-04-11 MED ORDER — MISOPROSTOL 25 MCG QUARTER TABLET
25.0000 ug | ORAL_TABLET | Freq: Once | ORAL | Status: AC
  Administered 2022-04-11: 25 ug via VAGINAL
  Filled 2022-04-11: qty 1

## 2022-04-11 NOTE — Progress Notes (Signed)
AROM. Progressing quickly, expectant management.   Gerlene Fee, DO OB Fellow, [place] [facility] 04/11/2022, 10:07 PM

## 2022-04-11 NOTE — Plan of Care (Signed)
  Problem: Education: Goal: Knowledge of General Education information will improve Description: Including pain rating scale, medication(s)/side effects and non-pharmacologic comfort measures Outcome: Completed/Met   Problem: Health Behavior/Discharge Planning: Goal: Ability to manage health-related needs will improve Outcome: Completed/Met   Problem: Clinical Measurements: Goal: Ability to maintain clinical measurements within normal limits will improve Outcome: Completed/Met Goal: Will remain free from infection Outcome: Completed/Met Goal: Diagnostic test results will improve Outcome: Completed/Met Goal: Respiratory complications will improve Outcome: Completed/Met Goal: Cardiovascular complication will be avoided Outcome: Completed/Met   Problem: Activity: Goal: Risk for activity intolerance will decrease Outcome: Completed/Met   Problem: Nutrition: Goal: Adequate nutrition will be maintained Outcome: Completed/Met   Problem: Coping: Goal: Level of anxiety will decrease Outcome: Completed/Met   Problem: Elimination: Goal: Will not experience complications related to bowel motility Outcome: Completed/Met Goal: Will not experience complications related to urinary retention Outcome: Completed/Met   Problem: Pain Managment: Goal: General experience of comfort will improve Outcome: Completed/Met   Problem: Safety: Goal: Ability to remain free from injury will improve Outcome: Completed/Met   Problem: Skin Integrity: Goal: Risk for impaired skin integrity will decrease Outcome: Completed/Met   Problem: Education: Goal: Knowledge of Childbirth will improve Outcome: Completed/Met Goal: Ability to make informed decisions regarding treatment and plan of care will improve Outcome: Completed/Met Goal: Ability to state and carry out methods to decrease the pain will improve Outcome: Completed/Met Goal: Individualized Educational Video(s) Outcome: Completed/Met    Problem: Coping: Goal: Ability to verbalize concerns and feelings about labor and delivery will improve Outcome: Completed/Met   Problem: Life Cycle: Goal: Ability to make normal progression through stages of labor will improve Outcome: Completed/Met Goal: Ability to effectively push during vaginal delivery will improve Outcome: Completed/Met   Problem: Role Relationship: Goal: Will demonstrate positive interactions with the child Outcome: Completed/Met   Problem: Safety: Goal: Risk of complications during labor and delivery will decrease Outcome: Completed/Met   Problem: Pain Management: Goal: Relief or control of pain from uterine contractions will improve Outcome: Completed/Met   Problem: Education: Goal: Knowledge of disease or condition will improve Outcome: Completed/Met Goal: Knowledge of the prescribed therapeutic regimen will improve Outcome: Completed/Met   Problem: Fluid Volume: Goal: Peripheral tissue perfusion will improve Outcome: Completed/Met   Problem: Clinical Measurements: Goal: Complications related to disease process, condition or treatment will be avoided or minimized Outcome: Completed/Met   

## 2022-04-11 NOTE — H&P (Signed)
OBSTETRIC ADMISSION HISTORY AND PHYSICAL  Andrea Silva is a 36 y.o. female [id number] with IUP at [redacted]w[redacted]d by U/S presenting for IOL for gHTN (normal labs). She reports +FMs, No LOF, no VB, no blurry vision, headaches or peripheral edema, and RUQ pain.  She plans on both breast and formula feeding. She request BTL for birth control but did not sign her consent on time (03/31/22), so wants depo as a bridge until her interval BTL. She received her prenatal care at [facility]   Dating: By early U/S --->  Estimated Date of Delivery: 04/30/22 Sono: @[redacted]w[redacted]d , CWD, normal anatomy, cephalic presentation, 2406g, , EFW 5lb 5oz  Prenatal History/Complications: Late PNC at 35wks, typically has late [facility] reporting difficulty getting into care.   Past Medical History: Past Medical History:  Diagnosis Date   Medical history non-contributory    Past Surgical History: Past Surgical History:  Procedure Laterality Date   NO PAST SURGERIES     Obstetrical History: OB History     Gravida  7   Para  6   Term  6   Preterm  0   AB  0   Living  6      SAB  0   IAB  0   Ectopic  0   Multiple  0   Live Births  6          Social History Social History   Socioeconomic History   Marital status: Single    Spouse name: Not on file   Number of children: Not on file   Years of education: Not on file   Highest education level: Not on file  Occupational History   Not on file  Tobacco Use   Smoking status: Former    Types: Cigarettes   Smokeless tobacco: Never  Vaping Use   Vaping Use: Never used  Substance and Sexual Activity   Alcohol use: No   Drug use: Yes    Types: Marijuana    Comment: "a couple months"   Sexual activity: Not Currently    Birth control/protection: None  Other Topics Concern   Not on file  Social History Narrative   Not on file   Social Determinants of Health   Financial Resource Strain: Not on file  Food Insecurity: Not on file  Transportation  Needs: Not on file  Physical Activity: Not on file  Stress: Not on file  Social Connections: Not on file   Family History: Family History  Problem Relation Age of Onset   Anemia Mother    Cancer Mother    COPD Mother    Alcohol abuse Father    Hypertension Father    Hyperlipidemia Father    Thyroid disease Sister    Diabetes Maternal Grandmother    Cancer Maternal Grandmother    Allergies: Allergies  Allergen Reactions   Latex Rash   Medications Prior to Admission  Medication Sig Dispense Refill Last Dose   acetaminophen (TYLENOL) 325 MG tablet Take 650 mg by mouth every 6 (six) hours as needed for moderate pain. (Patient not taking: Reported on 03/31/2022)      albuterol (PROVENTIL HFA;VENTOLIN HFA) 108 (90 Base) MCG/ACT inhaler Inhale 1-2 puffs into the lungs every 6 (six) hours as needed for wheezing or shortness of breath. (Patient not taking: Reported on 04/06/2020) 1 Inhaler 0    coconut oil OIL Apply 1 application topically as needed (nipple pain).  0    ondansetron (ZOFRAN-ODT) 4 MG disintegrating tablet Take 1  tablet (4 mg total) by mouth every 6 (six) hours as needed for nausea. 20 tablet 0    Prenatal Vit-Fe Fumarate-FA (PRENATAL MULTIVITAMIN) TABS tablet Take 1 tablet by mouth daily at 12 noon.       promethazine (PHENERGAN) 25 MG tablet Take 1 tablet (25 mg total) by mouth every 6 (six) hours as needed for nausea or vomiting. 30 tablet 0    Review of Systems All systems reviewed and negative except as stated in HPI  Blood pressure 138/73, pulse 74, temperature 98.7 F (37.1 C), temperature source Oral, resp. rate 18, height 5\' 11"  (1.803 m), weight 298 lb 11.2 oz (135.5 kg), last menstrual period 11/04/2021, SpO2 99 %, currently breastfeeding. General appearance: alert, cooperative, appears stated age, and mild distress Lungs: clear to auscultation bilaterally Heart: regular rate and rhythm Abdomen: soft, non-tender; bowel sounds normal Pelvic: Normal external  female genitalia Extremities: Homans sign is negative, no sign of DVT DTR's normal Presentation: cephalic Fetal monitoring: Baseline: 200 to 180 bpm, Variability: Good {> 6 bpm), Accelerations: reactive but tachycardic, and Decelerations: Absent Uterine activityNone Dilation: 2 Effacement (%): Thick Station: -3 Exam by:: Krystal Eaton RN  Prenatal labs: ABO, Rh: --/--/B POS (10/20 1240) Antibody: NEG (10/20 1240) Rubella: 2.12 (10/05 1328) RPR: NON REACTIVE (10/05 1328)  HBsAg: NON REACTIVE (10/05 1328)  HIV: Non Reactive (10/05 1328)  GBS: Negative/-- (10/18 1656)  GTT: None due to late PNC, A1C normal Genetic screening: None Anatomy US: Normal  Prenatal Transfer Tool  Maternal Diabetes: No Genetic Screening: Declined Maternal Ultrasounds/Referrals: Normal Fetal Ultrasounds or other Referrals:  None Maternal Substance Abuse:  No Significant Maternal Medications:  None Significant Maternal Lab Results:  Group B Strep negative Number of Prenatal Visits:Less than or equal to 3 verified prenatal visits Other Comments:  None  Results for orders placed or performed during the hospital encounter of 04/11/22 (from the past 24 hour(s))  Type and screen   Collection Time: 04/11/22 12:40 PM  Result Value Ref Range   ABO/RH(D) B POS    Antibody Screen NEG    Sample Expiration      04/14/2022,2359 Performed at [facility], [address], [place], [place] [address]   CBC   Collection Time: 04/11/22 12:44 PM  Result Value Ref Range   WBC 12.5 (H) 4.0 - 10.5 K/uL   RBC 3.60 (L) 3.87 - 5.11 MIL/uL   Hemoglobin 10.7 (L) 12.0 - 15.0 g/dL   HCT 31.6 (L) 36.0 - 46.0 %   MCV 87.8 80.0 - 100.0 fL   MCH 29.7 26.0 - 34.0 pg   MCHC 33.9 30.0 - 36.0 g/dL   RDW 12.1 11.5 - 15.5 %   Platelets 284 150 - 400 K/uL   nRBC 0.0 0.0 - 0.2 %  Comprehensive metabolic panel   Collection Time: 04/11/22 12:44 PM  Result Value Ref Range   Sodium 137 135 - 145 mmol/L   Potassium 3.4 (L) 3.5  - 5.1 mmol/L   Chloride 103 98 - 111 mmol/L   CO2 21 (L) 22 - 32 mmol/L   Glucose, Bld 121 (H) 70 - 99 mg/dL   BUN 8 6 - 20 mg/dL   Creatinine, Ser 0.60 0.44 - 1.00 mg/dL   Calcium 9.6 8.9 - 10.3 mg/dL   Total Protein 6.4 (L) 6.5 - 8.1 g/dL   Albumin 2.8 (L) 3.5 - 5.0 g/dL   AST 18 15 - 41 U/L   ALT 11 0 - 44 U/L  Alkaline Phosphatase 148 (H) 38 - 126 U/L   Total Bilirubin 0.7 0.3 - 1.2 mg/dL   GFR, Estimated >11 >91 mL/min   Anion gap 13 5 - 15  Resp Panel by RT-PCR (Flu A&B, Covid) Anterior Nasal Swab   Collection Time: 04/11/22  1:26 PM   Specimen: Anterior Nasal Swab  Result Value Ref Range   SARS Coronavirus 2 by RT PCR NEGATIVE NEGATIVE   Influenza A by PCR NEGATIVE NEGATIVE   Influenza B by PCR NEGATIVE NEGATIVE  Protein / creatinine ratio, urine   Collection Time: 04/11/22  1:26 PM  Result Value Ref Range   Creatinine, Urine 313 mg/dL   Total Protein, Urine 26 mg/dL   Protein Creatinine Ratio 0.08 0.00 - 0.15 mg/mg[Cre]   Patient Active Problem List   Diagnosis Date Noted   Gestational hypertension 04/11/2022   Supervision of high risk pregnancy, antepartum 03/31/2022   Prenatal care insufficient, third trimester 03/27/2022   Unwanted fertility 03/27/2022   Elevated BP without diagnosis of hypertension 03/27/2022   Assessment/Plan:  Kiely Cousar is a 36 y.o. G7P6006 at [redacted]w[redacted]d here for IOL for gHTN  #Labor: Will begin with cytotec oral + vaginal and ambulation #Pain: Planning epidural #FWB: Cat 2 due to tachycardia - mom has a low grade fever so 1000mg  Tylenol, Covid swab and fluid bolus ordered. FHR remains reactive so is overall reassuring. Will monitor closely. Pt reassured. #ID:  GBS negative #MOF: Breastmilk and formula #MOC: Interval BTL + Depo #Circ:  Yes, if boy  , CNM, MSN, Edd [place] Certified Nurse Midwife, [facility]

## 2022-04-11 NOTE — Lactation Note (Signed)
This note was copied from a baby's chart. Lactation Consultation Note  Patient Name: Andrea Silva NATFT'D Date: 04/11/2022 Reason for consult: L&D Initial assessment;Early term 37-38.6wks Age:34 hours Birth Parent latched infant on her right breast using the football hold, infant was on and off breast and was still breastfeeding for 12 minutes when [facility] left the room. Birth Parent will continue to BF infant according to hunger cues, on demand, 8 to 12+ times within 24 hours, STS. Birth Parent knows to ask RN/LC for further latch assistance if needed on MBU.  Maternal Data Does the patient have breastfeeding experience prior to this delivery?: Yes How long did the patient breastfeed?: Per Birth Parent she only BF 5 th child for 6 months and 6 th child was BF and supplemented with formula for 6 months, 6th child is currently 46 years old.  Feeding Mother's Current Feeding Choice: Breast Milk and Formula  LATCH Score Latch: Repeated attempts needed to sustain latch, nipple held in mouth throughout feeding, stimulation needed to elicit sucking reflex.  Audible Swallowing: A few with stimulation  Type of Nipple: Everted at rest and after stimulation  Comfort (Breast/Nipple): Soft / non-tender  Hold (Positioning): Assistance needed to correctly position infant at breast and maintain latch.  LATCH Score: 7   Lactation Tools Discussed/Used    Interventions Interventions: Assisted with latch;Skin to skin;Breast compression;Adjust position;Support pillows;Position options;Education  Discharge    Consult Status Consult Status: Follow-up Date: 04/12/22 Follow-up type: In-patient    Vicente Serene 04/11/2022, 11:01 PM

## 2022-04-11 NOTE — Progress Notes (Signed)
Patient ID: Andrea Silva, female   DOB: 07/17/1985, 36 y.o.   MRN: [id number]  Pt temp down to 98.4 after Tylenol, Covid and flu swabs negative. FHR baseline now 150s with good variability, moderate accels and no decels.   Gaylan Gerold, CNM, MSN, [place] Certified Nurse Midwife, [facility]

## 2022-04-12 ENCOUNTER — Encounter (HOSPITAL_COMMUNITY): Payer: Self-pay | Admitting: Obstetrics & Gynecology

## 2022-04-12 LAB — CBC
HCT: 24.3 % — ABNORMAL LOW (ref 36.0–46.0)
Hemoglobin: 8.4 g/dL — ABNORMAL LOW (ref 12.0–15.0)
MCH: 30.5 pg (ref 26.0–34.0)
MCHC: 34.6 g/dL (ref 30.0–36.0)
MCV: 88.4 fL (ref 80.0–100.0)
Platelets: 244 10*3/uL (ref 150–400)
RBC: 2.75 MIL/uL — ABNORMAL LOW (ref 3.87–5.11)
RDW: 12.2 % (ref 11.5–15.5)
WBC: 16.7 10*3/uL — ABNORMAL HIGH (ref 4.0–10.5)
nRBC: 0 % (ref 0.0–0.2)

## 2022-04-12 LAB — RPR: RPR Ser Ql: NONREACTIVE

## 2022-04-12 MED ORDER — ONDANSETRON HCL 4 MG/2ML IJ SOLN: 4.0000 mg | INTRAMUSCULAR | Status: DC | PRN

## 2022-04-12 MED ORDER — BENZOCAINE-MENTHOL 20-0.5 % EX AERO
1.0000 | INHALATION_SPRAY | CUTANEOUS | Status: DC | PRN
  Administered 2022-04-12: 1 via TOPICAL
  Filled 2022-04-12: qty 56

## 2022-04-12 MED ORDER — MEDROXYPROGESTERONE ACETATE 150 MG/ML IM SUSP
150.0000 mg | Freq: Once | INTRAMUSCULAR | Status: AC
  Administered 2022-04-13: 150 mg via INTRAMUSCULAR
  Filled 2022-04-12: qty 1

## 2022-04-12 MED ORDER — WITCH HAZEL-GLYCERIN EX PADS: 1.0000 | MEDICATED_PAD | CUTANEOUS | Status: DC | PRN

## 2022-04-12 MED ORDER — COCONUT OIL OIL: 1.0000 | TOPICAL_OIL | Status: DC | PRN

## 2022-04-12 MED ORDER — FUROSEMIDE 20 MG PO TABS
20.0000 mg | ORAL_TABLET | Freq: Every day | ORAL | Status: DC
  Administered 2022-04-12 – 2022-04-13 (×2): 20 mg via ORAL
  Filled 2022-04-12 (×2): qty 1

## 2022-04-12 MED ORDER — SENNOSIDES-DOCUSATE SODIUM 8.6-50 MG PO TABS
2.0000 | ORAL_TABLET | Freq: Every day | ORAL | Status: DC
  Administered 2022-04-12 – 2022-04-13 (×2): 2 via ORAL
  Filled 2022-04-12 (×2): qty 2

## 2022-04-12 MED ORDER — ONDANSETRON HCL 4 MG PO TABS: 4.0000 mg | ORAL_TABLET | ORAL | Status: DC | PRN

## 2022-04-12 MED ORDER — ACETAMINOPHEN 325 MG PO TABS: 650.0000 mg | ORAL_TABLET | ORAL | Status: DC | PRN

## 2022-04-12 MED ORDER — FUROSEMIDE 20 MG PO TABS: 20.0000 mg | ORAL_TABLET | Freq: Every day | ORAL | 1 refills | Status: DC

## 2022-04-12 MED ORDER — IBUPROFEN 600 MG PO TABS: 600.0000 mg | ORAL_TABLET | Freq: Four times a day (QID) | ORAL | 0 refills | Status: AC

## 2022-04-12 MED ORDER — TETANUS-DIPHTH-ACELL PERTUSSIS 5-2.5-18.5 LF-MCG/0.5 IM SUSY: 0.5000 mL | PREFILLED_SYRINGE | Freq: Once | INTRAMUSCULAR | Status: DC

## 2022-04-12 MED ORDER — OXYCODONE HCL 5 MG PO TABS: 5.0000 mg | ORAL_TABLET | ORAL | Status: DC | PRN

## 2022-04-12 MED ORDER — PRENATAL MULTIVITAMIN CH
1.0000 | ORAL_TABLET | Freq: Every day | ORAL | Status: DC
  Administered 2022-04-12 – 2022-04-13 (×2): 1 via ORAL
  Filled 2022-04-12 (×2): qty 1

## 2022-04-12 MED ORDER — IBUPROFEN 600 MG PO TABS
600.0000 mg | ORAL_TABLET | Freq: Four times a day (QID) | ORAL | Status: DC
  Administered 2022-04-12 (×4): 600 mg via ORAL
  Filled 2022-04-12 (×6): qty 1

## 2022-04-12 MED ORDER — ZOLPIDEM TARTRATE 5 MG PO TABS: 5.0000 mg | ORAL_TABLET | Freq: Every evening | ORAL | Status: DC | PRN

## 2022-04-12 MED ORDER — NIFEDIPINE ER 30 MG PO TB24: 30.0000 mg | ORAL_TABLET | Freq: Every day | ORAL | 1 refills | Status: DC

## 2022-04-12 MED ORDER — NIFEDIPINE ER OSMOTIC RELEASE 30 MG PO TB24
30.0000 mg | ORAL_TABLET | Freq: Every day | ORAL | Status: DC
  Administered 2022-04-12 – 2022-04-13 (×2): 30 mg via ORAL
  Filled 2022-04-12 (×2): qty 1

## 2022-04-12 MED ORDER — DIBUCAINE (PERIANAL) 1 % EX OINT: 1.0000 | TOPICAL_OINTMENT | CUTANEOUS | Status: DC | PRN

## 2022-04-12 MED ORDER — DIPHENHYDRAMINE HCL 25 MG PO CAPS: 25.0000 mg | ORAL_CAPSULE | Freq: Four times a day (QID) | ORAL | Status: DC | PRN

## 2022-04-12 MED ORDER — SIMETHICONE 80 MG PO CHEW: 80.0000 mg | CHEWABLE_TABLET | ORAL | Status: DC | PRN

## 2022-04-12 NOTE — Discharge Summary (Signed)
Postpartum Discharge Summary     Patient Name: Andrea Silva DOB: 1986-02-13 MRN: [id number]  Date of admission: 04/11/2022 Delivery date:04/11/2022  Delivering provider: Gerlene Fee  Date of discharge: 04/12/2022  Admitting diagnosis: Gestational hypertension [O13.9] Intrauterine pregnancy: [redacted]w[redacted]d    Secondary diagnosis:  Principal Problem:   Gestational hypertension  Additional problems: grand multiparity, limited PNC, undesired fertility, Acute blood loss anemia due to delivery    Discharge diagnosis: Term Pregnancy Delivered and Gestational Hypertension                                              Post partum procedures: IV Venofer Augmentation: AROM and Cytotec Complications: None  Hospital course: Induction of Labor With Vaginal Delivery   36y.o. yo G7P7007 at 344w2das admitted to the hospital 04/11/2022 for induction of labor.  Indication for induction: Gestational hypertension.  Patient had an labor course complicated byCytotec and pitocin Membrane Rupture Time/Date: 9:39 PM ,04/11/2022   Delivery Method:Vaginal, Spontaneous  Episiotomy: None  Lacerations:  None  Details of delivery can be found in separate delivery note.  Patient had a postpartum course complicated byanemia, received Venofer. BPs up and given lasix and procardia. Patient is discharged home 04/12/22.  Newborn Data: Birth date:04/11/2022  Birth time:9:53 PM  Gender:Female  Living status:Living  Apgars:9 ,9  Weight:3360 g   Magnesium Sulfate received: No BMZ received: No Rhophylac:No MMR:N/A T-DaP: unknown Flu: No Transfusion:No  Physical exam  Vitals:   04/12/22 0033 04/12/22 0136 04/12/22 0421 04/12/22 0750  BP: (!) 126/56 129/67 124/63 123/68  Pulse: 66 70 77 63  Resp: _0 Temp: (!) 97.5 F (36.4 C) 98.2 F (36.8 C) 97.9 F (36.6 C) 98.3 F (36.8 C)  TempSrc: Oral Oral Oral Oral  SpO2:      Weight:      Height:       General: alert, cooperative, and no  distress Lochia: appropriate Uterine Fundus: firm DVT Evaluation: No evidence of DVT seen on physical exam. Labs: Lab Results  Component Value Date   WBC 16.7 (H) 04/12/2022   HGB 8.4 (L) 04/12/2022   HCT 24.3 (L) 04/12/2022   MCV 88.4 04/12/2022   PLT 244 04/12/2022      Latest Ref Rng & Units 04/11/2022   12:44 PM  CMP  Glucose 70 - 99 mg/dL 121   BUN 6 - 20 mg/dL 8   Creatinine 0.44 - 1.00 mg/dL 0.60   Sodium 135 - 145 mmol/L 137   Potassium 3.5 - 5.1 mmol/L 3.4   Chloride 98 - 111 mmol/L 103   CO2 22 - 32 mmol/L 21   Calcium 8.9 - 10.3 mg/dL 9.6   Total Protein 6.5 - 8.1 g/dL 6.4   Total Bilirubin 0.3 - 1.2 mg/dL 0.7   Alkaline Phos 38 - 126 U/L 148   AST 15 - 41 U/L 18   ALT 0 - 44 U/L 11    Edinburgh Score:    04/06/2020   11:00 AM  Edinburgh Postnatal Depression Scale Screening Tool  I have been able to laugh and see the funny side of things. 0  I have looked forward with enjoyment to things. 0  I have blamed myself unnecessarily when things went wrong. 1  I have been anxious or worried for no good reason. 0  I have  felt scared or panicky for no good reason. 0  Things have been getting on top of me. 0  I have been so unhappy that I have had difficulty sleeping. 0  I have felt sad or miserable. 0  I have been so unhappy that I have been crying. 0  The thought of harming myself has occurred to me. 0  Edinburgh Postnatal Depression Scale Total 1     After visit meds:  Allergies as of 04/12/2022       Reactions   Latex Rash        Medication List     TAKE these medications    acetaminophen 325 MG tablet Commonly known as: TYLENOL Take 650 mg by mouth every 6 (six) hours as needed for moderate pain.   albuterol 108 (90 Base) MCG/ACT inhaler Commonly known as: VENTOLIN HFA Inhale 1-2 puffs into the lungs every 6 (six) hours as needed for wheezing or shortness of breath.   coconut oil Oil Apply 1 application topically as needed (nipple pain).    furosemide 20 MG tablet Commonly known as: LASIX Take 1 tablet (20 mg total) by mouth daily for 10 days.   ibuprofen 600 MG tablet Commonly known as: ADVIL Take 1 tablet (600 mg total) by mouth every 6 (six) hours.   NIFEdipine 30 MG 24 hr tablet Commonly known as: ADALAT CC Take 1 tablet (30 mg total) by mouth daily.   ondansetron 4 MG disintegrating tablet Commonly known as: ZOFRAN-ODT Take 1 tablet (4 mg total) by mouth every 6 (six) hours as needed for nausea.   prenatal multivitamin Tabs tablet Take 1 tablet by mouth daily at 12 noon.   promethazine 25 MG tablet Commonly known as: PHENERGAN Take 1 tablet (25 mg total) by mouth every 6 (six) hours as needed for nausea or vomiting.         Discharge home in stable condition Infant Feeding: Bottle and Breast Infant Disposition:home with mother Discharge instruction: per After Visit Summary and Postpartum booklet. Activity: Advance as tolerated. Pelvic rest for 6 weeks.  Diet: routine diet Future Appointments:No future appointments. Follow up Visit:  [place] for [place] at [facility] Follow up in 1 week(s).   Specialty: Obstetrics and Gynecology Why: blood pressure check Contact information: [place] [phone]                Message sent to [facility] by [place] on 04/12/2022  Please schedule this patient for a Virtual postpartum visit in 6 weeks with the following provider: Any provider. Additional Postpartum F/U:BP check 1 week  High risk pregnancy complicated by:  gHTN Delivery mode:  Vaginal, Spontaneous  Anticipated Birth Control:  Depo   04/12/2022 Donnamae Jude, MD

## 2022-04-12 NOTE — Lactation Note (Signed)
This note was copied from a baby's chart. Lactation Consultation Note  Patient Name: Andrea Silva BTDVV'O Date: 04/12/2022 Reason for consult: Follow-up assessment;Early term 77-38.6wks Age:34 hours  Visited with family of 35 hours old ETI female, Ms. Andrea Silva is a P7 and has some experienced breastfeeding. She chooses to supplement with formula while at the hospital, when this [facility] offered assistance with latch, MOB voiced baby just had a bottle, she has also offered the breast prior offering this bottle. Reviewed normal newborn behavior, feeding cues, lactogenesis II, pumping schedule and anticipatory guidelines.  Feeding Mother's Current Feeding Choice: Breast Milk and Formula Nipple Type: Slow - flow  Lactation Tools Discussed/Used Tools: Pump Breast pump type: Manual Pump Education: Setup, frequency, and cleaning Reason for Pumping: Mother's request Pumping frequency: every time baby is getting formula (recommended) Pumped volume:  (hasnt's started yet, she just got it)  Interventions Interventions: Breast feeding basics reviewed;Education;LC Services brochure  Plan of care Encouraged to take baby to breast 8-12 times/24 hours or sooner if feeding cues are present Pumping whenever baby is getting a bottle of formula was also strongly encouraged to protect her supply She'll call LC/RN for latch assistance when needed  No other support person at this point. All questions and concerns answered, family to contact [facility] services PRN.  Discharge Pump: Manual  Consult Status Consult Status: Follow-up Date: 04/13/22 Follow-up type: In-patient   Sarrinah Gardin Francene Boyers 04/12/2022, 5:01 PM

## 2022-04-12 NOTE — Progress Notes (Addendum)
POSTPARTUM PROGRESS NOTE  Post Partum Day 1  Subjective:  Andrea Silva is a 36 y.o. [id number] s/p SVD at [redacted]w[redacted]d.  She reports she is doing well. No acute events overnight. She denies any problems with ambulating, voiding or po intake. Denies nausea or vomiting.  Pain is well controlled.  Lochia is mild.  Objective: Blood pressure 123/68, pulse 63, temperature 98.3 F (36.8 C), temperature source Oral, resp. rate 18, height 5\' 11"  (1.803 m), weight 135.5 kg, last menstrual period 11/04/2021, SpO2 99 %, breast and bottle feeding.  Physical Exam:  General: alert, cooperative and no distress Chest: no respiratory distress Heart:regular rate, distal pulses intact Abdomen: soft, nontender,  Uterine Fundus: firm, appropriately tender DVT Evaluation: No calf swelling or tenderness Extremities: no edema Skin: warm, dry  Recent Labs    04/11/22 1922 04/12/22 0610  HGB 10.2* 8.4*  HCT 29.9* 24.3*    Assessment/Plan: Andrea Silva is a 36 y.o. [id number] s/p SVD at [redacted]w[redacted]d   PPD#1 - Doing well  Routine postpartum care  IV Venofer  Lasix  Contraception: Depo now, BTL in 6 weeks Feeding: bottle and breast Dispo: Plan for discharge today.   LOS: 1 day   Tawni Pummel 04/12/2022, 9:17 AM Medical Student

## 2022-04-13 NOTE — Plan of Care (Signed)
  Problem: Education: Goal: Knowledge of condition will improve Outcome: Completed/Met   Problem: Activity: Goal: Will verbalize the importance of balancing activity with adequate rest periods Outcome: Completed/Met   Problem: Role Relationship: Goal: Ability to demonstrate positive interaction with newborn will improve Outcome: Completed/Met   Problem: Skin Integrity: Goal: Demonstration of wound healing without infection will improve Outcome: Completed/Met   
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
## 2022-04-13 NOTE — Discharge Instructions (Signed)
Floradix liquid iron supplement from [employer] or [employer].

## 2022-04-13 NOTE — Plan of Care (Signed)
  Problem: Education: ?Goal: Knowledge of condition will improve ?Outcome: Completed/Met ?  ?Problem: Activity: ?Goal: Will verbalize the importance of balancing activity with adequate rest periods ?Outcome: Completed/Met ?  ?Problem: Role Relationship: ?Goal: Ability to demonstrate positive interaction with newborn will improve ?Outcome: Completed/Met ?  ?

## 2022-04-13 NOTE — Discharge Summary (Signed)
Postpartum Discharge Summary                            Patient Name: Andrea Silva DOB: Jan 22, 1986 MRN: [id number]   Date of admission: 04/11/2022 Delivery date:04/11/2022  Delivering provider: Gerlene Fee  Date of discharge: 04/12/2022   Admitting diagnosis: Gestational hypertension [O13.9] Intrauterine pregnancy: [redacted]w[redacted]d    Secondary diagnosis:  Principal Problem:   Gestational hypertension   Additional problems: grand multiparity, limited PNC, undesired fertility, Acute blood loss anemia due to delivery                            Discharge diagnosis: Term Pregnancy Delivered and Gestational Hypertension                                              Post partum procedures: None Augmentation: AROM and Cytotec Complications: None   Hospital course: Induction of Labor With Vaginal Delivery   36y.o. yo [id number] at 377w2das admitted to the hospital 04/11/2022 for induction of labor.  Indication for induction: Gestational hypertension.  Patient had an labor course complicated byCytotec and pitocin Membrane Rupture Time/Date: 9:39 PM ,04/11/2022   Delivery Method:Vaginal, Spontaneous  Episiotomy: None  Lacerations:  None  Details of delivery can be found in separate delivery note.  Patient had a postpartum course complicated byanemia, offered, declined Venofer. PO Iron. BPs up and given lasix and procardia. Patient is discharged home 04/12/22.   Newborn Data: Birth date:04/11/2022  Birth time:9:53 PM  Gender:Female  Living status:Living  Apgars:9 ,9  Weight:3360 g    Magnesium Sulfate received: No BMZ received: No Rhophylac:No MMR:N/A T-DaP: Declined Flu: No Transfusion:No   Physical exam   Patient Vitals for the past 24 hrs:  BP Temp Temp src Pulse Resp SpO2  04/13/22 0418 127/72 98.3 F (36.8 C) Oral 89 18 --  04/12/22 2128 134/73 98 F (36.7 C) Oral 73 17 99 %  04/12/22 1625 130/69 -- -- -- -- --  04/12/22 1235 130/69 98.9 F  (37.2 C) Oral 75 16 --  04/12/22 0750 123/68 98.3 F (36.8 C) Oral 63 18 --     General: alert, cooperative, and no distress Lochia: appropriate Uterine Fundus: firm DVT Evaluation: No evidence of DVT seen on physical exam. Labs: Recent Labs       Lab Results  Component Value Date    WBC 16.7 (H) 04/12/2022    HGB 8.4 (L) 04/12/2022    HCT 24.3 (L) 04/12/2022    MCV 88.4 04/12/2022    PLT 244 04/12/2022          Latest Ref Rng & Units 04/11/2022   12:44 PM  CMP  Glucose 70 - 99 mg/dL 121   BUN 6 - 20 mg/dL 8   Creatinine 0.44 - 1.00 mg/dL 0.60   Sodium 135 - 145 mmol/L 137   Potassium 3.5 - 5.1 mmol/L 3.4   Chloride 98 - 111 mmol/L 103   CO2 22 - 32 mmol/L 21   Calcium 8.9 - 10.3 mg/dL 9.6   Total Protein 6.5 - 8.1 g/dL 6.4   Total Bilirubin 0.3 - 1.2 mg/dL  0.7   Alkaline Phos 38 - 126 U/L 148   AST 15 - 41 U/L 18   ALT 0 - 44 U/L 11     Edinburgh Score:     04/06/2020   11:00 AM  Edinburgh Postnatal Depression Scale Screening Tool  I have been able to laugh and see the funny side of things. 0  I have looked forward with enjoyment to things. 0  I have blamed myself unnecessarily when things went wrong. 1  I have been anxious or worried for no good reason. 0  I have felt scared or panicky for no good reason. 0  Things have been getting on top of me. 0  I have been so unhappy that I have had difficulty sleeping. 0  I have felt sad or miserable. 0  I have been so unhappy that I have been crying. 0  The thought of harming myself has occurred to me. 0  Edinburgh Postnatal Depression Scale Total 1          After visit meds:  Allergies as of 04/12/2022         Reactions    Latex Rash            Medication List       TAKE these medications     acetaminophen 325 MG tablet Commonly known as: TYLENOL Take 650 mg by mouth every 6 (six) hours as needed for moderate pain.    albuterol 108 (90 Base) MCG/ACT inhaler Commonly known as: VENTOLIN  HFA Inhale 1-2 puffs into the lungs every 6 (six) hours as needed for wheezing or shortness of breath.    coconut oil Oil Apply 1 application topically as needed (nipple pain).    furosemide 20 MG tablet Commonly known as: LASIX Take 1 tablet (20 mg total) by mouth daily for 10 days.    ibuprofen 600 MG tablet Commonly known as: ADVIL Take 1 tablet (600 mg total) by mouth every 6 (six) hours.    NIFEdipine 30 MG 24 hr tablet Commonly known as: ADALAT CC Take 1 tablet (30 mg total) by mouth daily.    ondansetron 4 MG disintegrating tablet Commonly known as: ZOFRAN-ODT Take 1 tablet (4 mg total) by mouth every 6 (six) hours as needed for nausea.    prenatal multivitamin Tabs tablet Take 1 tablet by mouth daily at 12 noon.    promethazine 25 MG tablet Commonly known as: PHENERGAN Take 1 tablet (25 mg total) by mouth every 6 (six) hours as needed for nausea or vomiting.               Discharge home in stable condition Infant Feeding: Bottle and Breast Infant Disposition:home with mother Discharge instruction: per After Visit Summary and Postpartum booklet. Activity: Advance as tolerated. Pelvic rest for 6 weeks.  Diet: routine diet Future Appointments:No future appointments. Follow up Visit:   [facility] [place] [facility] Follow up in 1 week(s).   Specialty: Obstetrics and Gynecology Why: blood pressure check Contact information: [place] [address] [phone]

## 2022-04-21 ENCOUNTER — Ambulatory Visit (INDEPENDENT_AMBULATORY_CARE_PROVIDER_SITE_OTHER): Payer: Medicaid Other | Admitting: *Deleted

## 2022-04-21 ENCOUNTER — Encounter: Payer: Medicaid Other | Admitting: Obstetrics and Gynecology

## 2022-04-21 ENCOUNTER — Other Ambulatory Visit: Payer: Self-pay

## 2022-04-21 VITALS — BP 130/74 | HR 66 | Ht 71.0 in | Wt 288.2 lb

## 2022-04-21 DIAGNOSIS — Z8759 Personal history of other complications of pregnancy, childbirth and the puerperium: Secondary | ICD-10-CM

## 2022-04-21 DIAGNOSIS — Z0130 Encounter for examination of blood pressure without abnormal findings: Secondary | ICD-10-CM

## 2022-04-21 NOTE — Progress Notes (Signed)
Patient here for blood pressure check. S/P IOL for HTN at [redacted]w[redacted]d, delivered 04/11/22 and D/c 04/13/22. Reports she is taking her procardia and Lasix daily and took both today. Reports home bp has been around 127/62. Denies headaches or visual disturbances 1+ edema noted feet/ ankles. BP today 138/77, with repeat 130/74. Discussed with Dr. Clement Husbands and advised Lonn [place] to continue Procardia and Lasix as ordered. Reviewed pre-eclampsia precautions with her and to call as needed or go to hospital as needed. Reviewed her postpartum appointment. She voices understanding. Staci Acosta

## 2022-05-13 ENCOUNTER — Encounter (HOSPITAL_BASED_OUTPATIENT_CLINIC_OR_DEPARTMENT_OTHER): Payer: Self-pay | Admitting: Family Medicine

## 2022-05-13 NOTE — Progress Notes (Signed)
Spoke w/ via phone for pre-op interview--- Andrea Silva Lab needs dos----   UPT            Lab results------ COVID test -----patient states asymptomatic no test needed Arrive at -------1145 NPO after MN NO Solid Food.  Clear liquids from MN until---1045 Med rec completed Medications to take morning of surgery -----NONE Diabetic medication ----- Patient instructed no nail polish to be worn day of surgery Patient instructed to bring photo id and insurance card day of surgery Patient aware to have Driver (ride ) / caregiver Andrea Silva husband   for 24 hours after surgery  Patient Special Instructions ----- Pre-Op special Istructions ----- Patient verbalized understanding of instructions that were given at this phone interview. Patient denies shortness of breath, chest pain, fever, cough at this phone interview.

## 2022-05-14 NOTE — H&P (Signed)
  Andrea Silva is an 36 y.o. [phone] female.   Chief Complaint: Desires sterility HPI: Patient desires permanent sterility  Past Medical History:  Diagnosis Date   Medical history non-contributory     Past Surgical History:  Procedure Laterality Date   NO PAST SURGERIES      Family History  Problem Relation Age of Onset   Anemia Mother    Cancer Mother    COPD Mother    Alcohol abuse Father    Hypertension Father    Hyperlipidemia Father    Thyroid disease Sister    Diabetes Maternal Grandmother    Cancer Maternal Grandmother    Social History:  reports that she has quit smoking. Her smoking use included cigarettes. She has never used smokeless tobacco. She reports current drug use. Drug: Marijuana. She reports that she does not drink alcohol.  Allergies:  Allergies  Allergen Reactions   Latex Rash    No medications prior to admission.    A comprehensive review of systems was negative.  Height 5\' 11"  (1.803 m), weight 122.5 kg, last menstrual period 11/04/2021, currently breastfeeding. General appearance: alert, cooperative, and appears stated age Head: Normocephalic, without obvious abnormality, atraumatic Neck: supple, symmetrical, trachea midline Lungs:  normal effort Heart: regular rate and rhythm Abdomen: soft, non-tender; bowel sounds normal; no masses,  no organomegaly Extremities: extremities normal, atraumatic, no cyanosis or edema Skin: Skin color, texture, turgor normal. No rashes or lesions Neurologic: Grossly normal   No results found for this or any previous visit (from the past 24 hour(s)). No results found.  Assessment/Plan Principal Problem:   Encounter for sterilization  For laparoscopic bilateral salpingectomy Risks include but are not limited to bleeding, infection, injury to surrounding structures, including bowel, bladder and ureters, blood clots, and death.  Likelihood of success is high.    11/06/2021 05/14/2022, 10:08  AM

## 2022-05-19 NOTE — Anesthesia Preprocedure Evaluation (Signed)
Anesthesia Evaluation  Patient identified by MRN, date of birth, ID band Patient awake    Reviewed: Allergy & Precautions, NPO status , Patient's Chart, lab work & pertinent test results  Airway Mallampati: II  TM Distance: >3 FB Neck ROM: Full    Dental no notable dental hx. (+) Teeth Intact, Dental Advisory Given   Pulmonary former smoker   Pulmonary exam normal breath sounds clear to auscultation       Cardiovascular Exercise Tolerance: Good Normal cardiovascular exam Rhythm:Regular Rate:Normal     Neuro/Psych negative neurological ROS     GI/Hepatic negative GI ROS, Neg liver ROS,,,  Endo/Other    Renal/GU Lab Results      Component                Value               Date                      WBC                      8.9                 05/20/2022                HGB                      10.4 (L)            05/20/2022                HCT                      34.2 (L)            05/20/2022                MCV                      84.7                05/20/2022                PLT                      392                 05/20/2022                Musculoskeletal   Abdominal  (+) + obese (BMI 39.9)  Peds  Hematology  (+) Blood dyscrasia, anemia Lab Results      Component                Value               Date                      WBC                      16.7 (H)            04/12/2022                HGB                      8.4 (L)  04/12/2022                HCT                      24.3 (L)            04/12/2022                MCV                      88.4                04/12/2022                PLT                      244                 04/12/2022            Lab Results      Component                Value               Date                      WBC                      8.9                 05/20/2022                HGB                      10.4 (L)            05/20/2022                HCT                       34.2 (L)            05/20/2022                MCV                      84.7                05/20/2022                PLT                      392                 05/20/2022              Anesthesia Other Findings All: latex  Reproductive/Obstetrics                             Anesthesia Physical Anesthesia Plan  ASA: 3  Anesthesia Plan: General   Post-op Pain Management: Ketamine IV*   Induction: Intravenous  PONV Risk Score and Plan: 4 or greater and Scopolamine patch - Pre-op, Ondansetron, Midazolam and Treatment may vary due to age or medical condition  Airway Management Planned: Oral ETT  Additional Equipment: None  Intra-op Plan:   Post-operative Plan:   Informed  Consent: I have reviewed the patients History and Physical, chart, labs and discussed the procedure including the risks, benefits and alternatives for the proposed anesthesia with the patient or authorized representative who has indicated his/her understanding and acceptance.     Dental advisory given  Plan Discussed with:   Anesthesia Plan Comments:         Anesthesia Quick Evaluation

## 2022-05-20 ENCOUNTER — Encounter (HOSPITAL_BASED_OUTPATIENT_CLINIC_OR_DEPARTMENT_OTHER): Payer: Self-pay | Admitting: Family Medicine

## 2022-05-20 ENCOUNTER — Ambulatory Visit (HOSPITAL_BASED_OUTPATIENT_CLINIC_OR_DEPARTMENT_OTHER): Payer: Medicaid Other | Admitting: Anesthesiology

## 2022-05-20 ENCOUNTER — Ambulatory Visit (HOSPITAL_BASED_OUTPATIENT_CLINIC_OR_DEPARTMENT_OTHER)
Admission: RE | Admit: 2022-05-20 | Discharge: 2022-05-20 | Disposition: A | Discharge status: Home / Self Care | Payer: Medicaid Other | Attending: Family Medicine | Admitting: Family Medicine

## 2022-05-20 ENCOUNTER — Other Ambulatory Visit: Payer: Self-pay

## 2022-05-20 ENCOUNTER — Encounter (HOSPITAL_BASED_OUTPATIENT_CLINIC_OR_DEPARTMENT_OTHER)
Admission: RE | Disposition: A | Discharge status: Home / Self Care | Payer: Self-pay | Source: Home / Self Care | Attending: Family Medicine

## 2022-05-20 DIAGNOSIS — Z302 Encounter for sterilization: Secondary | ICD-10-CM | POA: Insufficient documentation

## 2022-05-20 DIAGNOSIS — Z6839 Body mass index (BMI) 39.0-39.9, adult: Secondary | ICD-10-CM

## 2022-05-20 DIAGNOSIS — Z87891 Personal history of nicotine dependence: Secondary | ICD-10-CM

## 2022-05-20 DIAGNOSIS — E669 Obesity, unspecified: Secondary | ICD-10-CM | POA: Diagnosis not present

## 2022-05-20 DIAGNOSIS — Z01818 Encounter for other preprocedural examination: Secondary | ICD-10-CM

## 2022-05-20 HISTORY — PX: LAPAROSCOPIC BILATERAL SALPINGECTOMY: SHX5889

## 2022-05-20 LAB — CBC
HCT: 34.2 % — ABNORMAL LOW (ref 36.0–46.0)
Hemoglobin: 10.4 g/dL — ABNORMAL LOW (ref 12.0–15.0)
MCH: 25.7 pg — ABNORMAL LOW (ref 26.0–34.0)
MCHC: 30.4 g/dL (ref 30.0–36.0)
MCV: 84.7 fL (ref 80.0–100.0)
Platelets: 392 10*3/uL (ref 150–400)
RBC: 4.04 MIL/uL (ref 3.87–5.11)
RDW: 14.1 % (ref 11.5–15.5)
WBC: 8.9 10*3/uL (ref 4.0–10.5)
nRBC: 0 % (ref 0.0–0.2)

## 2022-05-20 LAB — POCT PREGNANCY, URINE: Preg Test, Ur: NEGATIVE

## 2022-05-20 SURGERY — SALPINGECTOMY, BILATERAL, LAPAROSCOPIC
Anesthesia: General | Site: Abdomen | Laterality: Bilateral

## 2022-05-20 MED ORDER — 0.9 % SODIUM CHLORIDE (POUR BTL) OPTIME
TOPICAL | Status: DC | PRN
  Administered 2022-05-20: 500 mL

## 2022-05-20 MED ORDER — FENTANYL CITRATE (PF) 250 MCG/5ML IJ SOLN
INTRAMUSCULAR | Status: AC
  Filled 2022-05-20: qty 5

## 2022-05-20 MED ORDER — ONDANSETRON HCL 4 MG/2ML IJ SOLN
INTRAMUSCULAR | Status: DC | PRN
  Administered 2022-05-20: 4 mg via INTRAVENOUS

## 2022-05-20 MED ORDER — SUGAMMADEX SODIUM 200 MG/2ML IV SOLN
INTRAVENOUS | Status: DC | PRN
  Administered 2022-05-20: 300 mg via INTRAVENOUS

## 2022-05-20 MED ORDER — ALBUTEROL SULFATE HFA 108 (90 BASE) MCG/ACT IN AERS
INHALATION_SPRAY | RESPIRATORY_TRACT | Status: DC | PRN
  Administered 2022-05-20: 4 via RESPIRATORY_TRACT

## 2022-05-20 MED ORDER — ONDANSETRON HCL 4 MG/2ML IJ SOLN: 4.0000 mg | Freq: Once | INTRAMUSCULAR | Status: DC | PRN

## 2022-05-20 MED ORDER — PROPOFOL 10 MG/ML IV BOLUS
INTRAVENOUS | Status: AC
  Filled 2022-05-20: qty 20

## 2022-05-20 MED ORDER — PROPOFOL 10 MG/ML IV BOLUS
INTRAVENOUS | Status: DC | PRN
  Administered 2022-05-20: 200 mg via INTRAVENOUS

## 2022-05-20 MED ORDER — KETOROLAC TROMETHAMINE 30 MG/ML IJ SOLN: 30.0000 mg | Freq: Once | INTRAMUSCULAR | Status: DC | PRN

## 2022-05-20 MED ORDER — BUPIVACAINE HCL (PF) 0.25 % IJ SOLN
INTRAMUSCULAR | Status: DC | PRN
  Administered 2022-05-20: 30 mL

## 2022-05-20 MED ORDER — GABAPENTIN 300 MG PO CAPS
300.0000 mg | ORAL_CAPSULE | ORAL | Status: AC
  Administered 2022-05-20: 300 mg via ORAL

## 2022-05-20 MED ORDER — DEXAMETHASONE SODIUM PHOSPHATE 10 MG/ML IJ SOLN
INTRAMUSCULAR | Status: DC | PRN
  Administered 2022-05-20: 10 mg via INTRAVENOUS

## 2022-05-20 MED ORDER — DIPHENHYDRAMINE HCL 50 MG/ML IJ SOLN
INTRAMUSCULAR | Status: DC | PRN
  Administered 2022-05-20: 12.5 mg via INTRAVENOUS

## 2022-05-20 MED ORDER — ACETAMINOPHEN 500 MG PO TABS
1000.0000 mg | ORAL_TABLET | ORAL | Status: AC
  Administered 2022-05-20: 1000 mg via ORAL

## 2022-05-20 MED ORDER — CELECOXIB 200 MG PO CAPS
ORAL_CAPSULE | ORAL | Status: AC
  Filled 2022-05-20: qty 2

## 2022-05-20 MED ORDER — OXYCODONE HCL 5 MG PO TABS: 5.0000 mg | ORAL_TABLET | Freq: Four times a day (QID) | ORAL | 0 refills | Status: AC | PRN

## 2022-05-20 MED ORDER — OXYCODONE HCL 5 MG/5ML PO SOLN: 5.0000 mg | Freq: Once | ORAL | Status: DC | PRN

## 2022-05-20 MED ORDER — HYDROMORPHONE HCL 1 MG/ML IJ SOLN: 0.2500 mg | INTRAMUSCULAR | Status: DC | PRN

## 2022-05-20 MED ORDER — FENTANYL CITRATE (PF) 250 MCG/5ML IJ SOLN
INTRAMUSCULAR | Status: DC | PRN
  Administered 2022-05-20 (×3): 50 ug via INTRAVENOUS

## 2022-05-20 MED ORDER — ROCURONIUM BROMIDE 10 MG/ML (PF) SYRINGE
PREFILLED_SYRINGE | INTRAVENOUS | Status: DC | PRN
  Administered 2022-05-20: 50 mg via INTRAVENOUS

## 2022-05-20 MED ORDER — MIDAZOLAM HCL 2 MG/2ML IJ SOLN
INTRAMUSCULAR | Status: DC | PRN
  Administered 2022-05-20: 2 mg via INTRAVENOUS

## 2022-05-20 MED ORDER — LACTATED RINGERS IV SOLN: INTRAVENOUS | Status: DC

## 2022-05-20 MED ORDER — POVIDONE-IODINE 10 % EX SWAB: 2.0000 | Freq: Once | CUTANEOUS | Status: DC

## 2022-05-20 MED ORDER — ACETAMINOPHEN 500 MG PO TABS
ORAL_TABLET | ORAL | Status: AC
  Filled 2022-05-20: qty 2

## 2022-05-20 MED ORDER — DEXMEDETOMIDINE HCL IN NACL 80 MCG/20ML IV SOLN
INTRAVENOUS | Status: DC | PRN
  Administered 2022-05-20: 12 ug via BUCCAL

## 2022-05-20 MED ORDER — MIDAZOLAM HCL 2 MG/2ML IJ SOLN
INTRAMUSCULAR | Status: AC
  Filled 2022-05-20: qty 2

## 2022-05-20 MED ORDER — PHENYLEPHRINE 80 MCG/ML (10ML) SYRINGE FOR IV PUSH (FOR BLOOD PRESSURE SUPPORT)
PREFILLED_SYRINGE | INTRAVENOUS | Status: DC | PRN
  Administered 2022-05-20 (×4): 80 ug via INTRAVENOUS

## 2022-05-20 MED ORDER — GABAPENTIN 300 MG PO CAPS
ORAL_CAPSULE | ORAL | Status: AC
  Filled 2022-05-20: qty 1

## 2022-05-20 MED ORDER — CELECOXIB 200 MG PO CAPS
400.0000 mg | ORAL_CAPSULE | ORAL | Status: AC
  Administered 2022-05-20: 400 mg via ORAL

## 2022-05-20 MED ORDER — LIDOCAINE 2% (20 MG/ML) 5 ML SYRINGE
INTRAMUSCULAR | Status: DC | PRN
  Administered 2022-05-20: 100 mg via INTRAVENOUS

## 2022-05-20 MED ORDER — OXYCODONE HCL 5 MG PO TABS: 5.0000 mg | ORAL_TABLET | Freq: Once | ORAL | Status: DC | PRN

## 2022-05-20 SURGICAL SUPPLY — 38 items
ADH SKN CLS APL DERMABOND .7 (GAUZE/BANDAGES/DRESSINGS) ×1
APL SWBSTK 6 STRL LF DISP (MISCELLANEOUS) ×1
APPLICATOR COTTON TIP 6 STRL (MISCELLANEOUS) IMPLANT
APPLICATOR COTTON TIP 6IN STRL (MISCELLANEOUS) ×1
BLADE SURG 15 STRL LF DISP TIS (BLADE) ×1 IMPLANT
BLADE SURG 15 STRL SS (BLADE)
CABLE HIGH FREQUENCY MONO STRZ (ELECTRODE) IMPLANT
COVER MAYO STAND STRL (DRAPES) ×1 IMPLANT
DERMABOND ADVANCED .7 DNX12 (GAUZE/BANDAGES/DRESSINGS) ×1 IMPLANT
DURAPREP 26ML APPLICATOR (WOUND CARE) ×1 IMPLANT
GLOVE BIOGEL PI IND STRL 6 (GLOVE) IMPLANT
GLOVE BIOGEL PI IND STRL 6.5 (GLOVE) IMPLANT
GLOVE BIOGEL PI IND STRL 7.0 (GLOVE) ×4 IMPLANT
GLOVE SURG SS PI 6.0 STRL IVOR (GLOVE) IMPLANT
GLOVE SURG SS PI 7.0 STRL IVOR (GLOVE) IMPLANT
GOWN STRL REUS W/TWL XL LVL3 (GOWN DISPOSABLE) ×2 IMPLANT
KIT TURNOVER CYSTO (KITS) ×1 IMPLANT
NS IRRIG 500ML POUR BTL (IV SOLUTION) IMPLANT
PACK LAPAROSCOPY BASIN (CUSTOM PROCEDURE TRAY) ×1 IMPLANT
PACK TRENDGUARD 450 HYBRID PRO (MISCELLANEOUS) IMPLANT
PAD OB MATERNITY 4.3X12.25 (PERSONAL CARE ITEMS) ×1 IMPLANT
PAD PREP 24X48 CUFFED NSTRL (MISCELLANEOUS) ×1 IMPLANT
SET TUBE SMOKE EVAC HIGH FLOW (TUBING) ×1 IMPLANT
SHEARS HARMONIC ACE PLUS 36CM (ENDOMECHANICALS) IMPLANT
SLEEVE SCD COMPRESS KNEE MED (STOCKING) ×1 IMPLANT
SLEEVE Z-THREAD 5X100MM (TROCAR) IMPLANT
SOL PREP POV-IOD 4OZ 10% (MISCELLANEOUS) IMPLANT
SUT VIC AB 3-0 X1 27 (SUTURE) ×1 IMPLANT
SUT VICRYL 0 UR6 27IN ABS (SUTURE) ×1 IMPLANT
SYS BAG RETRIEVAL 10MM (BASKET)
SYSTEM BAG RETRIEVAL 10MM (BASKET) IMPLANT
TOWEL OR 17X26 10 PK STRL BLUE (TOWEL DISPOSABLE) ×1 IMPLANT
TRAY FOLEY W/BAG SLVR 14FR LF (SET/KITS/TRAYS/PACK) IMPLANT
TRENDGUARD 450 HYBRID PRO PACK (MISCELLANEOUS) ×1
TROCAR 5MMX150MM (TROCAR) IMPLANT
TROCAR BALLN 12MMX100 BLUNT (TROCAR) ×1 IMPLANT
TROCAR Z-THREAD FIOS 5X100MM (TROCAR) ×2 IMPLANT
WARMER LAPAROSCOPE (MISCELLANEOUS) ×1 IMPLANT

## 2022-05-20 NOTE — Interval H&P Note (Signed)
History and Physical Interval Note:  05/20/2022 1:17 PM  Andrea Silva  has presented today for surgery, with the diagnosis of Undesired Fertility.  The various methods of treatment have been discussed with the patient and family. After consideration of risks, benefits and other options for treatment, the patient has consented to  Procedure(s): LAPAROSCOPIC BILATERAL SALPINGECTOMY (Bilateral) as a surgical intervention.  The patient's history has been reviewed, patient examined, no change in status, stable for surgery.  I have reviewed the patient's chart and labs.  Questions were answered to the patient's satisfaction.  Patient counseled, r.e. Risks benefits of BTL, including permanency of procedure, discussed benefits of salpingectomy including almost no failure and prevention of ovarian cancer.  Patient verbalized understanding and desires to proceed    Reva Bores

## 2022-05-20 NOTE — Transfer of Care (Signed)
Immediate Anesthesia Transfer of Care Note  Patient: Andrea Silva  Procedure(s) Performed: LAPAROSCOPIC BILATERAL SALPINGECTOMY (Bilateral: Abdomen)  Patient Location: PACU  Anesthesia Type:General  Level of Consciousness: drowsy and patient cooperative  Airway & Oxygen Therapy: Patient Spontanous Breathing  Post-op Assessment: Report given to RN and Post -op Vital signs reviewed and stable  Post vital signs: Reviewed and stable  Last Vitals:  Vitals Value Taken Time  BP 135/81 05/20/22 1451  Temp    Pulse 53 05/20/22 1455  Resp 17 05/20/22 1455  SpO2 97 % 05/20/22 1455  Vitals shown include unvalidated device data.  Last Pain:  Vitals:   05/20/22 1210  TempSrc: Oral  PainSc: 0-No pain      Patients Stated Pain Goal: 5 (05/20/22 1210)  Complications: No notable events documented.

## 2022-05-20 NOTE — Anesthesia Procedure Notes (Signed)
Procedure Name: Intubation Date/Time: 05/20/2022 1:56 PM  Performed by: Clearnce Sorrel, CRNAPre-anesthesia Checklist: Patient identified, Emergency Drugs available, Suction available and Patient being monitored Patient Re-evaluated:Patient Re-evaluated prior to induction Oxygen Delivery Method: Circle System Utilized Preoxygenation: Pre-oxygenation with 100% oxygen Induction Type: IV induction Ventilation: Mask ventilation without difficulty Laryngoscope Size: Mac and 3 Grade View: Grade I Tube type: Oral Tube size: 7.0 mm Number of attempts: 1 Airway Equipment and Method: Stylet and Oral airway Placement Confirmation: ETT inserted through vocal cords under direct vision, positive ETCO2 and breath sounds checked- equal and bilateral Secured at: 22 cm Tube secured with: Tape Dental Injury: Teeth and Oropharynx as per pre-operative assessment

## 2022-05-20 NOTE — Op Note (Signed)
PROCEDURE DATE: 05/20/2022  PREOPERATIVE DIAGNOSES: Undesired fertility  POSTOPERATIVE DIAGNOSES: The same   PROCEDURE: Laparoscopic bilateral salpingctomy   SURGEON: Dr. Reva Bores   ASSISTANT: Karmen Stabs, RNFA  ANESTHESIOLOGIST: Trevor Iha, MD MD - GETT  INDICATIONS: 36 y.o. [phone] who desired permanent sterilization.  FINDINGS: Normal appearing uterus, tubes and ovaries   ESTIMATED BLOOD LOSS: 25 ml   SPECIMENS: Bilateral fallopian tubes to pathology  COMPLICATIONS: None immediately known   PROCEDURE IN DETAIL: The patient had sequential compression devices applied to her lower extremities while in the preoperative area. She was then taken to the operating room where general anesthesia was administered and was found to be adequate. She was placed in the dorsal lithotomy position, and was prepped and draped in a sterile manner. A Foley rubber catheter was inserted into her bladder and drained a clear unknown amount of urine. A speculum was placed inside the vagina, and the cervix grasped with an single tooth tenaculum. A Hulka tenaculum was placed through the cervix for uterine manipulation. Attention was then turned to the patient's abdomen where a 5-mm skin incision was made in the umbilicus.  A 5-mm trocar placed under direct visualization with an Optiview.  Intraperitoneal placement was confirmed and insufflation done. A survey of the patient's pelvis and abdomen revealed the findings above. Two  5-mm lower quadrant ports were then placed under direct visualization. On the right side, the tube was separated from the mesosalpinx with the Harmonic device.  On the left side, the tube was separated from the mesosalpinx with the Harmonic device.  The specimen was then removed from the abdomen through the 5-mm port, under direct visualization. The operative site was surveyed, and small amount of bleeding noted at left ovary. Cauterized with the Harmonic device. Irrigation and  suction done =and found to be hemostatic. No intraoperative injury to other surrounding organs was noted. The abdomen was desufflated and all instruments were then removed from the patient's abdomen. All skin incisions were closed with 3-0 Vicryl subcuticular stitches/Dermabond.   Reva Bores MD 05/20/2022 2:49 PM

## 2022-05-20 NOTE — Discharge Instructions (Signed)
No acetaminophen/Tylenol until after 6:15 pm today if needed.  No ibuprofen, Advil, Aleve, Motrin, ketorolac, meloxicam, naproxen, or other NSAIDS until after 6:15 pm today if needed.     Post Anesthesia Home Care Instructions  Activity: Get plenty of rest for the remainder of the day. A responsible individual must stay with you for 24 hours following the procedure.  For the next 24 hours, DO NOT: -Drive a car -Advertising copywriter -Drink alcoholic beverages -Take any medication unless instructed by your physician -Make any legal decisions or sign important papers.  Meals: Start with liquid foods such as gelatin or soup. Progress to regular foods as tolerated. Avoid greasy, spicy, heavy foods. If nausea and/or vomiting occur, drink only clear liquids until the nausea and/or vomiting subsides. Call your physician if vomiting continues.  Special Instructions/Symptoms: Your throat may feel dry or sore from the anesthesia or the breathing tube placed in your throat during surgery. If this causes discomfort, gargle with warm salt water. The discomfort should disappear within 24 hours.

## 2022-05-21 ENCOUNTER — Telehealth (INDEPENDENT_AMBULATORY_CARE_PROVIDER_SITE_OTHER): Payer: Medicaid Other | Admitting: Obstetrics & Gynecology

## 2022-05-21 ENCOUNTER — Encounter (HOSPITAL_BASED_OUTPATIENT_CLINIC_OR_DEPARTMENT_OTHER): Payer: Self-pay | Admitting: Family Medicine

## 2022-05-21 DIAGNOSIS — Z9889 Other specified postprocedural states: Secondary | ICD-10-CM | POA: Diagnosis not present

## 2022-05-21 LAB — SURGICAL PATHOLOGY

## 2022-05-21 NOTE — Progress Notes (Signed)
Provider location: [facility] for [facility]   Patient location: Home  I connected with [name] on 05/21/22 at  9:55 AM EST by Mychart Video Encounter and verified that I am speaking with the correct person using two identifiers.       I discussed the limitations, risks, security and privacy concerns of performing an evaluation and management Silva virtually and the availability of in person appointments. I also discussed with the patient that there may be a patient responsible charge related to this Silva. The patient expressed understanding and agreed to proceed.  Post Partum Visit Note Subjective:   Andrea Silva is a 36 y.o. [phone] female who presents for a postpartum visit. She is 5 weeks 5 days postpartum following a normal spontaneous vaginal delivery.  I have fully reviewed the prenatal and intrapartum course. The delivery was at [redacted]w[redacted]d.  Anesthesia: none. Postpartum course has been uncomplicated. Baby is doing well.Pecola Leisure is feeding by both breast and bottle - Similac Total Care 360 . Bleeding staining only. Bowel function is normal. Bladder function is normal. Patient is not sexually active. Contraception method: underwent laparoscopic bilateral salpingectomy 05/20/22. Postpartum depression screening: negative.   Edinburgh Postnatal Depression Scale - 05/21/22 1005       Edinburgh Postnatal Depression Scale:  In the Past 7 Days   I have been able to laugh and see the funny side of things. 0    I have looked forward with enjoyment to things. 0    I have blamed myself unnecessarily when things went wrong. 0    I have been anxious or worried for no good reason. 0    I have felt scared or panicky for no good reason. 0    Things have been getting on top of me. 0    I have been so unhappy that I have had difficulty sleeping. 0    I have felt sad or miserable. 0    I have been so unhappy that I have been crying. 0    The thought of harming  myself has occurred to me. 0    Edinburgh Postnatal Depression Scale Total 0             The following portions of the patient's history were reviewed and updated as appropriate: allergies, current medications, past family history, past medical history, past social history, past surgical history, and problem list.  Review of Systems Pertinent items noted in HPI and remainder of comprehensive ROS otherwise negative.  Objective:  Breastfeeding Yes     General:  Alert, oriented and cooperative. Patient is in no acute distress.  Respiratory: Normal respiratory effort, no problems with respiration noted  Mental Status: Normal mood and affect. Normal behavior. Normal judgment and thought content.  Rest of physical exam deferred due to type of encounter   Assessment:    Normal postpartum exam.  Plan:  Essential components of care per ACOG recommendations:  1.  Mood and well being: Patient with negative depression screening today. Reviewed local resources for support.  - Patient does not use tobacco. - hx of drug use? No   2. Infant care and feeding:  -Patient currently breastmilk feeding? Yes.  If breastmilk feeding discussed return to work and pumping. If needed, patient was provided letter for work to allow for every 2-3 hr pumping breaks, and to be granted a private location to express breastmilk and refrigerated area to store breastmilk. Reviewed importance of draining breast regularly to  support lactation. -Social determinants of health (SDOH) reviewed in EPIC. No concerns.  3. Sexuality, contraception and birth spacing - Patient does not want a pregnancy in the next year.  Desired family size is 7 children.  - Already had salpingectomy  4. Sleep and fatigue -Encouraged family/partner/community support of 4 hrs of uninterrupted sleep to help with mood and fatigue  5. Physical Recovery  - Discussed patients delivery - Patient had no laceration. - Patient has urinary  incontinence? No - Patient is safe to resume physical and sexual activity  6.  Health Maintenance - Last pap smear done over two years and was normal   I provided 10 minutes of face-to-face time during this encounter.  Return in about 3 weeks (around 06/11/2022) for Postop BTL appointment, and pap smear.  No future appointments.  Jaynie Collins, MD Center for [employer], [facility]

## 2022-05-21 NOTE — Anesthesia Postprocedure Evaluation (Signed)
Anesthesia Post Note  Patient: Dealer  Procedure(s) Performed: LAPAROSCOPIC BILATERAL SALPINGECTOMY (Bilateral: Abdomen)     Patient location during evaluation: PACU Anesthesia Type: General Level of consciousness: awake and alert Pain management: pain level controlled Vital Signs Assessment: post-procedure vital signs reviewed and stable Respiratory status: spontaneous breathing, nonlabored ventilation, respiratory function stable and patient connected to nasal cannula oxygen Cardiovascular status: blood pressure returned to baseline and stable Postop Assessment: no apparent nausea or vomiting Anesthetic complications: no  No notable events documented.  Last Vitals:  Vitals:   05/20/22 1515 05/20/22 1600  BP: 128/82 119/76  Pulse: (!) 42 (!) 44  Resp: 11 16  Temp:  36.7 C  SpO2: 100% 100%    Last Pain:  Vitals:   05/20/22 1600  TempSrc:   PainSc: 3                  Trevor Iha

## 2022-05-26 ENCOUNTER — Telehealth: Payer: Self-pay

## 2022-05-26 NOTE — Telephone Encounter (Signed)
Called and spoke with patient--pt requested status of... Breast pump insurance paperwork  Short term disability paperwork needed for work--pt stated that her workplace faxed [place] paperwork to fill out and fax back to them   Informed patient that we have not received anything yet in regards to the breast pump and that for the short term paperwork, we would need her to come into the office to sign a ROI that'll allow [place] to fill out the paperwork and send it back to her workplace.   Patient verbalized understanding and stated she would stop by the office on Thursday 05/29/22 to sign ROI (will also bring copy of workplace paperwork just in case).   Maureen Ralphs RN  05/26/22 at [phone]

## 2022-06-05 ENCOUNTER — Telehealth (INDEPENDENT_AMBULATORY_CARE_PROVIDER_SITE_OTHER): Payer: Medicaid Other | Admitting: Family Medicine

## 2022-06-05 DIAGNOSIS — Z302 Encounter for sterilization: Secondary | ICD-10-CM

## 2022-06-05 DIAGNOSIS — Z09 Encounter for follow-up examination after completed treatment for conditions other than malignant neoplasm: Secondary | ICD-10-CM

## 2022-06-05 DIAGNOSIS — Z0289 Encounter for other administrative examinations: Secondary | ICD-10-CM

## 2022-06-05 DIAGNOSIS — Z4889 Encounter for other specified surgical aftercare: Secondary | ICD-10-CM

## 2022-06-05 NOTE — Progress Notes (Signed)
     GYNECOLOGY VIRTUAL VISIT ENCOUNTER NOTE  Provider location: [facility] for [facility]   Patient location: Home  I connected with [name] on 06/05/22 at  8:15 AM EST by MyChart Video Encounter and verified that I am speaking with the correct person using two identifiers.   I discussed the limitations, risks, security and privacy concerns of performing an evaluation and management service virtually and the availability of in person appointments. I also discussed with the patient that there may be a patient responsible charge related to this service. The patient expressed understanding and agreed to proceed.   History:  Andrea Silva is a 36 y.o. [phone] female being evaluated today for postop check. S/p bilateral salpingectomy She denies any abnormal vaginal discharge, bleeding, pelvic pain or other concerns.       Past Medical History:  Diagnosis Date   Medical history non-contributory    Past Surgical History:  Procedure Laterality Date   LAPAROSCOPIC BILATERAL SALPINGECTOMY Bilateral 05/20/2022   Procedure: LAPAROSCOPIC BILATERAL SALPINGECTOMY;  Surgeon: Reva Bores, MD;  Location: [facility] [place];  Service: Gynecology;  Laterality: Bilateral;   NO PAST SURGERIES     The following portions of the patient's history were reviewed and updated as appropriate: allergies, current medications, past family history, past medical history, past social history, past surgical history and problem list.   Health Maintenance:  Needs pap  Review of Systems:  Pertinent items noted in HPI and remainder of comprehensive ROS otherwise negative.  Physical Exam:   General:  Alert, oriented and cooperative. Patient appears to be in no acute distress.  Mental Status: Normal mood and affect. Normal behavior. Normal judgment and thought content.   Respiratory: Normal respiratory effort, no problems with respiration noted  Rest of physical exam  deferred due to type of encounter  Labs and Imaging No results found for this or any previous visit (from the past 336 hour(s)). No results found.     Assessment and Plan:     1. Postop check Doing well. No issues with bowel or bladder. No fever/chills.   2. Encounter for sterilization S/p BTL       I discussed the assessment and treatment plan with the patient. The patient was provided an opportunity to ask questions and all were answered. The patient agreed with the plan and demonstrated an understanding of the instructions.   The patient was advised to call back or seek an in-person evaluation/go to the ED if the symptoms worsen or if the condition fails to improve as anticipated.  I provided 5 minutes of face-to-face time during this encounter.   Reva Bores, MD Center for [employer], [facility]

## 2022-06-05 NOTE — Progress Notes (Signed)
Virtual Visit via Video Note  I connected with Andrea Silva on 06/05/22 at  8:15 AM EST by a video enabled telemedicine application and verified that I am speaking with the correct person using two identifiers.  Location: Patient: Home Provider: [facility]   I discussed the limitations of evaluation and management by telemedicine and the availability of in person appointments. The patient expressed understanding and agreed to proceed.  History of Present Illness:  laparoscopic bilateral salpingectomy    Observations/Objective:  Patient states that since her procedure she has not had any issues or concerns. Feels like she has healed well. No issues with pain or bleeding.  Assessment and Plan:   Follow Up Instructions:    I discussed the assessment and treatment plan with the patient. The patient was provided an opportunity to ask questions and all were answered. The patient agreed with the plan and demonstrated an understanding of the instructions.   The patient was advised to call back or seek an in-person evaluation if the symptoms worsen or if the condition fails to improve as anticipated.  I provided 5 minutes of non-face-to-face time during this encounter.   Aviva Signs, CMA

## 2022-06-25 ENCOUNTER — Telehealth: Payer: Self-pay | Admitting: *Deleted

## 2022-06-25 NOTE — Telephone Encounter (Signed)
Received a voicemail from [employer] from a [employer] on behalf of [employer]? Stating she is calling to check status on faxed requests for medical records and asks to verify if received. JOIN#[id number]. Asks for call back as is needed urgently. I verified with front desk one was sent 06/18/22 for [employer].  I called number back and heard a voice message but then heard a message the user cannot accept more messages. Staci Acosta

## 2022-08-08 ENCOUNTER — Ambulatory Visit
Admission: RE | Admit: 2022-08-08 | Discharge: 2022-08-08 | Disposition: A | Discharge status: Home / Self Care | Payer: Medicaid Other | Source: Ambulatory Visit | Attending: Physician Assistant | Admitting: Physician Assistant

## 2022-08-08 VITALS — BP 130/72 | HR 77 | Temp 99.1°F | Resp 18

## 2022-08-08 DIAGNOSIS — H1032 Unspecified acute conjunctivitis, left eye: Secondary | ICD-10-CM | POA: Diagnosis not present

## 2022-08-08 DIAGNOSIS — J019 Acute sinusitis, unspecified: Secondary | ICD-10-CM | POA: Diagnosis not present

## 2022-08-08 MED ORDER — POLYMYXIN B-TRIMETHOPRIM 10000-0.1 UNIT/ML-% OP SOLN: 1.0000 [drp] | OPHTHALMIC | 0 refills | Status: AC

## 2022-08-08 MED ORDER — AMOXICILLIN-POT CLAVULANATE 875-125 MG PO TABS: 1.0000 | ORAL_TABLET | Freq: Two times a day (BID) | ORAL | 0 refills | Status: AC

## 2022-08-08 NOTE — ED Triage Notes (Signed)
Pt c/o left ear irritation, bilateral ear fullness and congestion.   Started: 1 week ago to yesterday

## 2022-08-08 NOTE — ED Provider Notes (Signed)
[facility]    CSN: [id number] Arrival date & time: 08/08/22  1511      History   Chief Complaint Chief Complaint  Patient presents with   Conjunctivitis    Entered by patient   Nasal Congestion   Ear Fullness    HPI Andrea Silva is a 37 y.o. female.   Patient here today for eval ration of left ear irritation, bilateral ear fullness and congestion as well as sinus pressure that started about a week ago.  She reports that symptoms have waxed and waned but seem to have worsened over the last few days.  She has not any fever.  She does report this morning that she woke with left eye crusting. She denies any vomiting or diarrhea.   The history is provided by the patient.  Conjunctivitis Pertinent negatives include no abdominal pain and no shortness of breath.  Ear Fullness Pertinent negatives include no abdominal pain and no shortness of breath.    Past Medical History:  Diagnosis Date   Medical history non-contributory     Patient Active Problem List   Diagnosis Date Noted   History of gestational hypertension 04/11/2022   Elevated BP without diagnosis of hypertension 03/27/2022    Past Surgical History:  Procedure Laterality Date   LAPAROSCOPIC BILATERAL SALPINGECTOMY Bilateral 05/20/2022   Procedure: LAPAROSCOPIC BILATERAL SALPINGECTOMY;  Surgeon: Donnamae Jude, MD;  Location: [place];  Service: Gynecology;  Laterality: Bilateral;   NO PAST SURGERIES      OB History     Gravida  7   Para  7   Term  7   Preterm  0   AB  0   Living  7      SAB  0   IAB  0   Ectopic  0   Multiple  0   Live Births  7            Home Medications    Prior to Admission medications   Medication Sig Start Date End Date Taking? Authorizing Provider  amoxicillin-clavulanate (AUGMENTIN) 875-125 MG tablet Take 1 tablet by mouth every 12 (twelve) hours. 08/08/22  Yes Francene Finders, PA-C  trimethoprim-polymyxin b (POLYTRIM)  ophthalmic solution Place 1 drop into the left eye every 4 (four) hours for 7 days. 08/08/22 08/15/22 Yes Francene Finders, PA-C  acetaminophen (TYLENOL) 325 MG tablet Take 650 mg by mouth every 6 (six) hours as needed for moderate pain. Patient not taking: Reported on 06/05/2022    [provider]  ibuprofen (ADVIL) 600 MG tablet Take 1 tablet (600 mg total) by mouth every 6 (six) hours. Patient not taking: Reported on 06/05/2022 04/12/22   Donnamae Jude, MD  oxyCODONE (OXY IR/ROXICODONE) 5 MG immediate release tablet Take 1 tablet (5 mg total) by mouth every 6 (six) hours as needed for severe pain. Patient not taking: Reported on 05/21/2022 05/20/22   Donnamae Jude, MD  Prenatal Vit-Fe Fumarate-FA (PRENATAL MULTIVITAMIN) TABS tablet Take 1 tablet by mouth daily at 12 noon.     [provider]    Family History Family History  Problem Relation Age of Onset   Anemia Mother    Cancer Mother    COPD Mother    Alcohol abuse Father    Hypertension Father    Hyperlipidemia Father    Thyroid disease Sister    Diabetes Maternal Grandmother    Cancer Maternal Grandmother     Social History Social History  Tobacco Use   Smoking status: Former    Types: Cigarettes   Smokeless tobacco: Never  Vaping Use   Vaping Use: Never used  Substance Use Topics   Alcohol use: No   Drug use: Yes    Types: Marijuana    Comment: "a couple months"     Allergies   Latex   Review of Systems Review of Systems  Constitutional:  Negative for chills and fever.  HENT:  Positive for congestion, sinus pressure and sore throat. Negative for ear pain.   Eyes:  Negative for discharge and redness.  Respiratory:  Negative for cough, shortness of breath and wheezing.   Gastrointestinal:  Negative for abdominal pain, diarrhea, nausea and vomiting.     Physical Exam Triage Vital Signs ED Triage Vitals  Enc Vitals Group     BP 08/08/22 1558 130/72     Pulse Rate 08/08/22 1558 77      Resp 08/08/22 1558 18     Temp 08/08/22 1558 99.1 F (37.3 C)     Temp Source 08/08/22 1558 Oral     SpO2 08/08/22 1558 96 %     Weight --      Height --      Head Circumference --      Peak Flow --      Pain Score 08/08/22 1557 6     Pain Loc --      Pain Edu? --      Excl. in [place]? --    No data found.  Updated Vital Signs BP 130/72 (BP Location: Left Arm)   Pulse 77   Temp 99.1 F (37.3 C) (Oral)   Resp 18   LMP 08/03/2022   SpO2 96%   Breastfeeding No Comment: not currently    Physical Exam Vitals and nursing note reviewed.  Constitutional:      General: She is not in acute distress.    Appearance: Normal appearance. She is not ill-appearing.  HENT:     Head: Normocephalic and atraumatic.     Ears:     Comments: Bilateral Tms dull, nonerythematous    Nose: Congestion present.     Mouth/Throat:     Mouth: Mucous membranes are moist.     Pharynx: No oropharyngeal exudate or posterior oropharyngeal erythema.  Eyes:     Conjunctiva/sclera: Conjunctivae normal.  Cardiovascular:     Rate and Rhythm: Normal rate and regular rhythm.     Heart sounds: Normal heart sounds. No murmur heard. Pulmonary:     Effort: Pulmonary effort is normal. No respiratory distress.     Breath sounds: Normal breath sounds. No wheezing, rhonchi or rales.  Skin:    General: Skin is warm and dry.  Neurological:     Mental Status: She is alert.  Psychiatric:        Mood and Affect: Mood normal.        Thought Content: Thought content normal.      UC Treatments / Results  Labs (all labs ordered are listed, but only abnormal results are displayed) Labs Reviewed - No data to display  EKG   Radiology No results found.  Procedures Procedures (including critical care time)  Medications Ordered in UC Medications - No data to display  Initial Impression / Assessment and Plan / UC Course  I have reviewed the triage vital signs and the nursing notes.  Pertinent labs & imaging  results that were available during my care of the patient were reviewed by  me and considered in my medical decision making (see chart for details).    Will treat to cover sinusitis with augmentin. Discussed that conjunctiva appears normal at this time but given reported drainage and crusting antibiotic drop prescribed. Encouraged follow up if no gradual improvement or with any further concerns.   Final Clinical Impressions(s) / UC Diagnoses   Final diagnoses:  Acute sinusitis, recurrence not specified, unspecified location  Acute conjunctivitis of left eye, unspecified acute conjunctivitis type   Discharge Instructions   None    ED Prescriptions     Medication Sig Dispense Auth. Provider   amoxicillin-clavulanate (AUGMENTIN) 875-125 MG tablet Take 1 tablet by mouth every 12 (twelve) hours. 14 tablet Ewell Poe F, PA-C   trimethoprim-polymyxin b (POLYTRIM) ophthalmic solution Place 1 drop into the left eye every 4 (four) hours for 7 days. 10 mL Francene Finders, PA-C      PDMP not reviewed this encounter.   Francene Finders, PA-C 08/08/22 1728
